# Patient Record
Sex: Female | Born: 1975 | Hispanic: No | Marital: Married | Smoking: Never smoker
Health system: Southern US, Community
[De-identification: ages and names within clinical notes are randomized; demographics above are authoritative.]

## PROBLEM LIST (undated history)

## (undated) DIAGNOSIS — E785 Hyperlipidemia, unspecified: Secondary | ICD-10-CM

## (undated) DIAGNOSIS — I10 Essential (primary) hypertension: Secondary | ICD-10-CM

## (undated) DIAGNOSIS — N946 Dysmenorrhea, unspecified: Secondary | ICD-10-CM

## (undated) DIAGNOSIS — F419 Anxiety disorder, unspecified: Secondary | ICD-10-CM

## (undated) DIAGNOSIS — I499 Cardiac arrhythmia, unspecified: Secondary | ICD-10-CM

## (undated) DIAGNOSIS — F32A Depression, unspecified: Secondary | ICD-10-CM

## (undated) DIAGNOSIS — K602 Anal fissure, unspecified: Secondary | ICD-10-CM

## (undated) DIAGNOSIS — F329 Major depressive disorder, single episode, unspecified: Secondary | ICD-10-CM

## (undated) DIAGNOSIS — B029 Zoster without complications: Secondary | ICD-10-CM

## (undated) DIAGNOSIS — J45990 Exercise induced bronchospasm: Secondary | ICD-10-CM

## (undated) DIAGNOSIS — B2790 Infectious mononucleosis, unspecified without complication: Secondary | ICD-10-CM

## (undated) HISTORY — DX: Anxiety disorder, unspecified: F41.9

## (undated) HISTORY — DX: Dysmenorrhea, unspecified: N94.6

## (undated) HISTORY — DX: Hyperlipidemia, unspecified: E78.5

## (undated) HISTORY — DX: Zoster without complications: B02.9

## (undated) HISTORY — DX: Cardiac arrhythmia, unspecified: I49.9

## (undated) HISTORY — DX: Exercise induced bronchospasm: J45.990

## (undated) HISTORY — DX: Infectious mononucleosis, unspecified without complication: B27.90

## (undated) HISTORY — PX: CRANIECTOMY SUBOCCIPITAL W/ CERVICAL LAMINECTOMY / CHIARI: SUR327

## (undated) HISTORY — DX: Essential (primary) hypertension: I10

## (undated) HISTORY — DX: Depression, unspecified: F32.A

## (undated) HISTORY — DX: Anal fissure, unspecified: K60.2

## (undated) HISTORY — DX: Major depressive disorder, single episode, unspecified: F32.9

## (undated) HISTORY — PX: COLONOSCOPY: SHX174

---
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
## 1999-07-01 ENCOUNTER — Other Ambulatory Visit: Admission: RE | Admit: 1999-07-01 | Discharge: 1999-07-01 | Payer: Self-pay | Admitting: *Deleted

## 2001-03-07 ENCOUNTER — Other Ambulatory Visit: Admission: RE | Admit: 2001-03-07 | Discharge: 2001-03-07 | Payer: Self-pay | Admitting: *Deleted

## 2002-11-05 ENCOUNTER — Other Ambulatory Visit: Admission: RE | Admit: 2002-11-05 | Discharge: 2002-11-05 | Payer: Self-pay | Admitting: Obstetrics and Gynecology

## 2003-04-16 ENCOUNTER — Encounter: Admission: RE | Admit: 2003-04-16 | Discharge: 2003-04-16 | Payer: Self-pay | Admitting: Obstetrics and Gynecology

## 2003-04-28 ENCOUNTER — Emergency Department (HOSPITAL_COMMUNITY): Admission: AD | Admit: 2003-04-28 | Discharge: 2003-04-28 | Payer: Self-pay | Admitting: Family Medicine

## 2003-10-08 ENCOUNTER — Emergency Department (HOSPITAL_COMMUNITY): Admission: EM | Admit: 2003-10-08 | Discharge: 2003-10-08 | Payer: Self-pay | Admitting: Family Medicine

## 2003-11-06 ENCOUNTER — Encounter: Admission: RE | Admit: 2003-11-06 | Discharge: 2003-11-06 | Payer: Self-pay | Admitting: Obstetrics and Gynecology

## 2004-02-22 HISTORY — PX: CRANIECTOMY SUBOCCIPITAL W/ CERVICAL LAMINECTOMY / CHIARI: SUR327

## 2004-05-03 ENCOUNTER — Other Ambulatory Visit: Admission: RE | Admit: 2004-05-03 | Discharge: 2004-05-03 | Payer: Self-pay | Admitting: Obstetrics and Gynecology

## 2004-08-17 ENCOUNTER — Inpatient Hospital Stay (HOSPITAL_COMMUNITY): Admission: RE | Admit: 2004-08-17 | Discharge: 2004-08-21 | Payer: Self-pay | Admitting: Neurosurgery

## 2004-11-19 ENCOUNTER — Other Ambulatory Visit: Payer: Self-pay

## 2004-11-19 ENCOUNTER — Emergency Department: Payer: Self-pay | Admitting: Emergency Medicine

## 2005-10-12 ENCOUNTER — Inpatient Hospital Stay (HOSPITAL_COMMUNITY): Admission: AD | Admit: 2005-10-12 | Discharge: 2005-10-15 | Payer: Self-pay | Admitting: Obstetrics and Gynecology

## 2006-06-26 ENCOUNTER — Ambulatory Visit (HOSPITAL_COMMUNITY): Admission: RE | Admit: 2006-06-26 | Discharge: 2006-06-26 | Payer: Self-pay | Admitting: Neurosurgery

## 2006-11-28 ENCOUNTER — Other Ambulatory Visit: Admission: RE | Admit: 2006-11-28 | Discharge: 2006-11-28 | Payer: Self-pay | Admitting: Obstetrics & Gynecology

## 2008-02-04 ENCOUNTER — Other Ambulatory Visit: Admission: RE | Admit: 2008-02-04 | Discharge: 2008-02-04 | Payer: Self-pay | Admitting: Obstetrics & Gynecology

## 2009-03-02 ENCOUNTER — Encounter: Admission: RE | Admit: 2009-03-02 | Discharge: 2009-03-02 | Payer: Self-pay | Admitting: Obstetrics and Gynecology

## 2009-10-15 DIAGNOSIS — R0602 Shortness of breath: Secondary | ICD-10-CM | POA: Insufficient documentation

## 2009-10-15 DIAGNOSIS — R002 Palpitations: Secondary | ICD-10-CM | POA: Insufficient documentation

## 2009-10-15 DIAGNOSIS — J45990 Exercise induced bronchospasm: Secondary | ICD-10-CM | POA: Insufficient documentation

## 2009-10-15 HISTORY — DX: Palpitations: R00.2

## 2009-10-21 ENCOUNTER — Ambulatory Visit: Payer: Self-pay | Admitting: Cardiology

## 2009-12-03 ENCOUNTER — Ambulatory Visit: Payer: Self-pay

## 2009-12-03 ENCOUNTER — Encounter: Payer: Self-pay | Admitting: Cardiology

## 2009-12-03 ENCOUNTER — Ambulatory Visit (HOSPITAL_COMMUNITY): Admission: RE | Admit: 2009-12-03 | Discharge: 2009-12-03 | Payer: Self-pay | Admitting: Cardiology

## 2009-12-03 ENCOUNTER — Ambulatory Visit: Payer: Self-pay | Admitting: Cardiology

## 2010-01-10 ENCOUNTER — Inpatient Hospital Stay (HOSPITAL_COMMUNITY): Admission: AD | Admit: 2010-01-10 | Discharge: 2010-01-12 | Payer: Self-pay | Admitting: Obstetrics and Gynecology

## 2010-03-21 LAB — CONVERTED CEMR LAB
BUN: 6 mg/dL (ref 6–23)
CO2: 26 meq/L (ref 19–32)
Calcium: 8.8 mg/dL (ref 8.4–10.5)
Creatinine, Ser: 0.4 mg/dL (ref 0.4–1.2)
GFR calc non Af Amer: 185.74 mL/min (ref >60)
Glucose, Bld: 77 mg/dL (ref 70–99)

## 2010-03-25 NOTE — Assessment & Plan Note (Signed)
Summary: np6/ 20 weeks preganant/ skipping beats./ pt has uhc/ gd   Visit Type:  new pt visit Referring Provider:  Malva Limes Primary Provider:  [facility]  CC:  palpitations...denies any cp or sob or edema..pt is [redacted] weeks gestation.  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  She is currently [redacted] weeks pregnant. She had a history of palpitations back in her late 52s with a negative evaluation at that time. I do not have any records.  She notices these most when she is sitting still or lying down. Sometimes they positional change will help. She has not had any syncope or presyncope. There is been no sustained symptoms of tachycardia.  She only drinks one caffeinated beverage a day.  There is no history of thyroid disease. Her hemoglobin was 12.6 in May of this year. There is no previous cardiac history.  She denies orthopnea, PND or edema.  She has one child that she delivered vaginally. She did not have any cardiac or other difficulties.  Current Medications (verified): 1)  Prenatal Vitamins 0.8 Mg Tabs (Prenatal Multivit-Min-Fe-Fa) .Marland Kitchen.. 1 Tab Once Daily  Allergies: 1)  ! Pcn  Past History:  Past Medical History: Last updated: 10/15/2009 Chiari type 1 malformation Candidiasis Asthma history.Marland Kitchenexercise induced  Past Surgical History: Last updated: 10/15/2009 1.  Chiari decompression--craniocervical decompression with dural patch      grafting.  2.  Microdissection.  SURGEON:  Kathaleen Maser. Pool, M.D.  Family History: Last updated: 10/15/2009 Family History of Coronary Artery Disease: MGM & MGF Family History of Hypertension: MGM & MGF Family History of Thyroid Dysfunction: MGF Family History of Renal Disease: MGM Family History of Neuroligic Seizure Disorder: MGF Parkinson's Family History of Cancer: MGM colon ca and MGF Leukemia Family History of Depression: Mother  Social  History: Last updated: 10/15/2009 Full Time...[facility] Married  Drug Use - no  Review of Systems       negative other than history of present illness  Vital Signs:  Patient profile:   35 year old female Height:      69 inches Weight:      204.12 pounds BMI:     30.25 Pulse rate:   72 / minute Pulse rhythm:   irregular BP sitting:   112 / 70  (left arm) Cuff size:   large  Vitals Entered By: Danielle Rankin, CMA (October 21, 2009 10:33 AM)  Physical Exam  General:  Well developed, well nourished, in no acute distress. Head:  normocephalic and atraumatic Eyes:  PERRLA/EOM intact; conjunctiva and lids normal. Neck:  Neck supple, no JVD. No masses, thyromegaly or abnormal cervical nodes. thyroid is palpable but nontender and no signs of asymmetry or cyst Chest Wall:  no deformities or breast masses noted Lungs:  Clear bilaterally to auscultation and percussion. Heart:  PMI nondisplaced, regular rate and rhythm, no murmur rub or gallop. Normal S1-S2, carotid shows equal bilaterally without bruits Abdomen:  gravida, positive bowel Msk:  Back normal, normal gait. Muscle strength and tone normal. Pulses:  pulses normal in all 4 extremities Extremities:  No clubbing or cyanosis. Neurologic:  Alert and oriented x 3. Skin:  Intact without lesions or rashes. Psych:  Normal affect.   Impression & Recommendations:  Problem # 1:  PALPITATIONS (ICD-785.1) Assessment New  I suspect her palpitations are benign. Will check magnesium, potassium, TSH and a 2-D echocardiogram. If these are normal, reassurance were given. This should have  no effect on her method of delivery. I've asked her lemonade caffeine and I suspect her palpitations after delivery will improve. If not all be glad to see her back.  Orders: EKG w/ Interpretation (93000) TLB-BMP (Basic Metabolic Panel-BMET) (80048-METABOL) TLB-TSH (Thyroid Stimulating Hormone) (84443-TSH) TLB-Magnesium (Mg) (83735-MG) Echocardiogram  (Echo)  Patient Instructions: 1)  Your physician recommends that you schedule a follow-up appointment in: as needed with Dr. Daleen Squibb 2)  Your physician recommends that you continue on your current medications as directed. Please refer to the Current Medication list given to you today. 3)  Your physician recommends that you have lab work today : mg, tsh, bmet 4)  Your physician has requested that you have an echocardiogram.  Echocardiography is a painless test that uses sound waves to create images of your heart. It provides your doctor with information about the size and shape of your heart and how well your heart's chambers and valves are working.  This procedure takes approximately one hour. There are no restrictions for this procedure.

## 2010-05-04 LAB — CBC
HCT: 37 % (ref 36.0–46.0)
MCH: 34.2 pg — ABNORMAL HIGH (ref 26.0–34.0)
MCH: 34.5 pg — ABNORMAL HIGH (ref 26.0–34.0)
MCHC: 34.6 g/dL (ref 30.0–36.0)
MCHC: 34.7 g/dL (ref 30.0–36.0)
MCV: 98.5 fL (ref 78.0–100.0)
MCV: 99.8 fL (ref 78.0–100.0)
Platelets: 146 10*3/uL — ABNORMAL LOW (ref 150–400)
RBC: 3.38 MIL/uL — ABNORMAL LOW (ref 3.87–5.11)
RDW: 13.4 % (ref 11.5–15.5)
WBC: 6.5 10*3/uL (ref 4.0–10.5)

## 2010-07-09 NOTE — Discharge Summary (Signed)
[name], [name]              ACCOUNT NO.:  [id number]   MEDICAL RECORD NO.:  [id number]          PATIENT TYPE:  INP   LOCATION:  3039                         FACILITY:  [facility]   PHYSICIAN:  Kathaleen Maser. Pool, M.D.    DATE OF BIRTH:  05-Aug-1975   DATE OF ADMISSION:  08/17/2004  DATE OF DISCHARGE:  08/21/2004                                 DISCHARGE SUMMARY   FINAL DIAGNOSIS:  Chiari type 1 malformation.   OPERATIONS/TREATMENT:  Chiari decompression.   HISTORY OF PRESENT ILLNESS:  Ms. Judy Foster is 35 year old female with symptomatic  Chiari type 1 malformation who presents now for Chiari decompression.   HOSPITAL COURSE:  The patient was taken to the operating room where an  uncomplicated Chiari decompression was performed. Postoperative, the patient  did very well. She was wrapped with mobilize. She had no difficulty with  wound healing or other neurological problems postoperative. She was  discharged on the fourth postoperative day to home.   CONDITION ON DISCHARGE:  Improved.   FOLLOW UP:  Follow-up is in one week in my office.           ______________________________  Kathaleen Maser. Pool, M.D.     HAP/MEDQ  D:  10/12/2004  T:  10/12/2004  Job:  [id number]

## 2010-07-09 NOTE — Op Note (Signed)
Judy Foster, Judy Foster              ACCOUNT NO.:  [id number]   MEDICAL RECORD NO.:  [id number]          PATIENT TYPE:  INP   LOCATION:  2899                         FACILITY:  [facility]   PHYSICIAN:  Kathaleen Maser. Pool, M.D.    DATE OF BIRTH:  May 20, 1975   DATE OF PROCEDURE:  08/17/2004  DATE OF DISCHARGE:                                 OPERATIVE REPORT   PREOPERATIVE DIAGNOSIS:  Chiari type 1 malformation.   POSTOPERATIVE DIAGNOSIS:  Chiari type 1 malformation.   OPERATION PERFORMED:  1.  Chiari decompression--craniocervical decompression with dural patch      grafting.  2.  Microdissection.   SURGEON:  Kathaleen Maser. Pool, M.D.   ASSISTANT:  Tia Alert, MD   ANESTHESIA:  General orotracheal anesthesia.   INDICATIONS FOR PROCEDURE:  Judy Foster is a 35 year old female with a history  of suboccipital headaches and neck pain with extension into both upper  extremities.  Work-up demonstrates evidence of a very significant type 1  Chiari malformation.  The patient has been counseled as to her options.  She  has decided to proceed with a Chiari decompression for hopeful improvement  in her symptoms.   DESCRIPTION OF PROCEDURE:  The patient was taken to the operating room and  placed on the table in supine position.  After an adequate level of  anesthesia was achieved, the patient was positioned prone onto bolsters with  her head fixed in a somewhat flexed position.  The head was held in place  with a Mayfield pin head rest.  The patient's posterior cervical and  suboccipital scalp was prepped and draped sterilely. The 10 blade was used  to make a linear skin incision extending from the occiput down to the level  of the C2 spinous process. This was carried down sharply in the midline.  Subperiosteal dissection was then performed exposing the lamina of C1 and C2  as well as the suboccipital bone.  Deep self-retaining retractors were  placed.  A suboccipital craniectomy was then performed  using the high speed  drill and Kerrison rongeurs to remove the inferior aspect of the  suboccipital bone from the level of the occiput down to the foramen magnum  and to the posterior aspect of the occipital condyles bilaterally.  The C1  lamina was also removed.  The circular sinus was coagulated and divided.  The dura was then opened in the midline at approximately the C1 level.  CSF  was allowed to drain.  The dural opening was then extended cranially. The  cerebellar tonsils came in to view.  The dural opening was then cut in the  shape of a Y overlying the cerebellar hemispheres.  Microscope was brought  in for microdissection.  The cerebellar tonsils were dissected free.  Dissection then proceeded down to the level of the obex.  CSF was released  under pressure. There was no evidence of any obstructive mass or other  problems.  A dural patch graft consisting of Duragard/bovine pericardium was  then cut to the appropriate size and then sutured into the dura in a running  fashion  using 4-0 Nurolon sutures. After a watertight closure had been  achieved, the patient was given a Valsalva maneuver and there was no  evidence of leakage found the dural repair. The dural repair was then  further covered with Duragen and Tisseel fibrin glue  sealant. Gelfoam was placed over this repair.  The wound was then closed in  layers with Vicryl sutures.  Steri-Strips and sterile dressing were applied.  There were no apparent complications.  The patient tolerated the procedure  well and she returned to recovery room postoperatively.       HAP/MEDQ  D:  08/17/2004  T:  08/17/2004  Job:  [id number]

## 2012-05-16 ENCOUNTER — Telehealth: Payer: Self-pay | Admitting: Certified Nurse Midwife

## 2012-05-16 ENCOUNTER — Encounter: Payer: Self-pay | Admitting: Certified Nurse Midwife

## 2012-05-16 NOTE — Telephone Encounter (Signed)
Spoke with pt who reports moodiness, irritability, and anger with cycles for 4-5 months. Would like to discuss with DL. Appt sched 05-17-12 at 4 pm per pt request. aa

## 2012-05-16 NOTE — Telephone Encounter (Signed)
Pt is having problems with her cycle and would like nurse to call.

## 2012-05-17 ENCOUNTER — Encounter: Payer: Self-pay | Admitting: Certified Nurse Midwife

## 2012-05-17 ENCOUNTER — Ambulatory Visit (INDEPENDENT_AMBULATORY_CARE_PROVIDER_SITE_OTHER): Payer: 59 | Admitting: Certified Nurse Midwife

## 2012-05-17 VITALS — BP 90/60

## 2012-05-17 DIAGNOSIS — N939 Abnormal uterine and vaginal bleeding, unspecified: Secondary | ICD-10-CM

## 2012-05-17 DIAGNOSIS — N926 Irregular menstruation, unspecified: Secondary | ICD-10-CM

## 2012-05-17 DIAGNOSIS — N943 Premenstrual tension syndrome: Secondary | ICD-10-CM

## 2012-05-17 NOTE — Progress Notes (Signed)
37 yo mwf g3 p2012 here complaining of irritability around 2 days before menstrual cycle and then "blah" feeling for 2 days into cycle. Also has occasional headache, but no migraine.  Some fatigue all the time with some heat/cold intolerance.  Does regular exercise. Sleeps well.  No crying episodes just has these few days of not being herself  O:  Healthy female appropriate dress.  Smiling not anxious appearance.  Orientation X 3 AEX 03-11-12 all WNLl  Exam: Thyroid normal no nodules  A: Premenstrual Symptoms 2-Fatigue rule out Thyroid issues  P;Discussed concerns with PMS, given diet to follow and start on B Complex with Vitamin C on two weeks prior to menses. Increase outdoor activity  Keep menses calendar of symptoms  Patient agreeable to plan 2- Labs TSH, Vitamin D RV 1 month  30 minutes spent with patient with >50% of time spent in face to face counseling. Reviewed, TL

## 2012-05-18 LAB — TSH: TSH: 1.699 u[IU]/mL (ref 0.350–4.500)

## 2012-05-22 NOTE — Progress Notes (Signed)
05/22/12 @ 10:35am lmtcb

## 2012-07-16 ENCOUNTER — Encounter (HOSPITAL_COMMUNITY): Payer: Self-pay | Admitting: Emergency Medicine

## 2012-07-16 ENCOUNTER — Emergency Department (HOSPITAL_COMMUNITY)
Admission: EM | Admit: 2012-07-16 | Discharge: 2012-07-16 | Disposition: A | Discharge status: Home / Self Care | Payer: 59 | Attending: Emergency Medicine | Admitting: Emergency Medicine

## 2012-07-16 DIAGNOSIS — Z79899 Other long term (current) drug therapy: Secondary | ICD-10-CM | POA: Insufficient documentation

## 2012-07-16 DIAGNOSIS — R450 Nervousness: Secondary | ICD-10-CM | POA: Insufficient documentation

## 2012-07-16 DIAGNOSIS — Z8742 Personal history of other diseases of the female genital tract: Secondary | ICD-10-CM | POA: Insufficient documentation

## 2012-07-16 DIAGNOSIS — R110 Nausea: Secondary | ICD-10-CM | POA: Insufficient documentation

## 2012-07-16 DIAGNOSIS — F410 Panic disorder [episodic paroxysmal anxiety] without agoraphobia: Secondary | ICD-10-CM | POA: Insufficient documentation

## 2012-07-16 DIAGNOSIS — J45901 Unspecified asthma with (acute) exacerbation: Secondary | ICD-10-CM | POA: Insufficient documentation

## 2012-07-16 DIAGNOSIS — R197 Diarrhea, unspecified: Secondary | ICD-10-CM | POA: Insufficient documentation

## 2012-07-16 DIAGNOSIS — Z8619 Personal history of other infectious and parasitic diseases: Secondary | ICD-10-CM | POA: Insufficient documentation

## 2012-07-16 DIAGNOSIS — Z880 Allergy status to penicillin: Secondary | ICD-10-CM | POA: Insufficient documentation

## 2012-07-16 DIAGNOSIS — R42 Dizziness and giddiness: Secondary | ICD-10-CM | POA: Insufficient documentation

## 2012-07-16 MED ORDER — LORAZEPAM 1 MG PO TABS
1.0000 mg | ORAL_TABLET | Freq: Once | ORAL | Status: AC
  Administered 2012-07-16: 1 mg via ORAL
  Filled 2012-07-16: qty 1

## 2012-07-16 NOTE — ED Notes (Addendum)
Pt reports sudden onset of abdominal pain while eating and had diarrhea. Pt reports that she felt "impending doom for a few minutes". Also "I felt like I was hyperventilating" and "it was nothing I had felt before." Pt also reports feeling dizzy.

## 2012-07-16 NOTE — ED Provider Notes (Signed)
History     CSN: [id number]  Arrival date & time 07/16/12  1458   First MD Initiated Contact with Patient 07/16/12 1524      Chief Complaint  Patient presents with  . Diarrhea  . Nausea  . Palpitations    (Consider location/radiation/quality/duration/timing/severity/associated sxs/prior treatment) HPI Comments: 37 year old female no significant past medical history presents to emergency department complaining of "not feeling right" while eating lunch today with her mother and daughter. Patient states while she was eating, she developed "queasiness", stood up, felt heart palpitations and "impending doom" lasting only a few minutes. She felt as if she was hyperventilating and became very anxious, dizzy, followed by an episode of diarrhea. States she was taking her mom and daughter out to lunch because she recently found out that her mom's boyfriend is cheating on her and wants to do something nice. Admits this is causing her some anxiety. Denies being under increased stress, however states her job can get stressful and she is in a period of transitioning. Denies fever, vomiting, abdominal pain. Continues to state "I am so embarrassed". All symptoms have since subsided besides "queeziness".   Patient is a 37 y.o. female presenting with diarrhea and palpitations. The history is provided by the patient.  Diarrhea Associated symptoms: no abdominal pain, no chills, no fever, no headaches and no vomiting   Palpitations Associated symptoms: dizziness and nausea   Associated symptoms: no back pain, no chest pain, no shortness of breath and no vomiting     Past Medical History  Diagnosis Date  . Dysmenorrhea   . Asthma, exercise induced   . Mononucleosis 1993    Past Surgical History  Procedure Laterality Date  . Craniectomy suboccipital w/ cervical laminectomy / chiari      Family History  Problem Relation Age of Onset  . Cancer Maternal Grandmother     colon cancer  . Cancer  Maternal Grandfather     leukemia  . Heart disease Maternal Grandfather     History  Substance Use Topics  . Smoking status: Never Smoker   . Smokeless tobacco: Not on file  . Alcohol Use: No    OB History   Grav Para Term Preterm Abortions TAB SAB Ect Mult Living   3    1  1   2       Review of Systems  Constitutional: Negative for fever, chills and appetite change.  Eyes: Negative for visual disturbance.  Respiratory: Negative for chest tightness and shortness of breath.   Cardiovascular: Positive for palpitations. Negative for chest pain.  Gastrointestinal: Positive for nausea and diarrhea. Negative for vomiting, abdominal pain and blood in stool.  Genitourinary: Negative for dysuria, urgency, frequency, vaginal bleeding, vaginal discharge and pelvic pain.  Musculoskeletal: Negative for back pain.  Neurological: Positive for dizziness. Negative for syncope, weakness, light-headedness and headaches.  Psychiatric/Behavioral: Negative for confusion. The patient is nervous/anxious.   All other systems reviewed and are negative.    Allergies  Penicillins  Home Medications   Current Outpatient Rx  Name  Route  Sig  Dispense  Refill  . fexofenadine (ALLEGRA) 180 MG tablet   Oral   Take 180 mg by mouth every morning.         . minocycline (MINOCIN,DYNACIN) 100 MG capsule   Oral   Take 100 mg by mouth 2 (two) times daily.         . Multiple Vitamin (MULTIVITAMIN WITH MINERALS) TABS   Oral   Take 1 tablet  by mouth every morning.         . psyllium (HYDROCIL/METAMUCIL) 95 % PACK   Oral   Take 1 packet by mouth daily.           BP 131/76  Pulse 65  Temp(Src) 98 F (36.7 C) (Oral)  SpO2 100%  LMP 07/05/2012  Physical Exam  Nursing note and vitals reviewed. Constitutional: She is oriented to person, place, and time. She appears well-developed and well-nourished. No distress.  HENT:  Head: Normocephalic and atraumatic.  Mouth/Throat: Oropharynx is clear  and moist.  Eyes: Conjunctivae and EOM are normal. Pupils are equal, round, and reactive to light.  Neck: Normal range of motion. Neck supple.  Cardiovascular: Normal rate, regular rhythm, normal heart sounds and intact distal pulses.  Exam reveals no gallop and no friction rub.   No murmur heard. Pulmonary/Chest: Effort normal and breath sounds normal. No respiratory distress. She has no wheezes. She exhibits no tenderness.  Abdominal: Soft. Bowel sounds are normal. She exhibits no distension and no mass. There is no rigidity, no rebound and no guarding.  Palpation of mid-epigastric region creates "queeziness".  Musculoskeletal: Normal range of motion. She exhibits no edema.  Lymphadenopathy:    She has no cervical adenopathy.  Neurological: She is alert and oriented to person, place, and time. She has normal strength. No sensory deficit. Gait normal.  Skin: Skin is warm and dry. She is not diaphoretic.  Psychiatric: Her speech is normal and behavior is normal. Thought content normal. Her mood appears anxious.  Tearful    ED Course  Procedures (including critical care time)  Labs Reviewed - No data to display No results found.   1. Panic attack       MDM  37 y/o female with anxiety attack. Denies history of same, but admits to post-partum depression with her second child. States the lunch with her mom was supposed to be a nice way to help her mom, but just caused her anxiety. Continues to states he is embarrassed and apologizing. Comforted her and told her no need to apologize. She will ativan given. She will f/u with PCP this week. Stress management discussed. Return precautions discussed. Patient states understanding of plan and is agreeable.     Trevor Mace, PA-C 07/16/12 1624

## 2012-07-16 NOTE — ED Notes (Signed)
Previous triage notes were not entered by Milus Glazier but by [place] Sayge Salvato Rn.

## 2012-07-18 NOTE — ED Provider Notes (Signed)
Medical screening examination/treatment/procedure(s) were performed by non-physician practitioner and as supervising physician I was immediately available for consultation/collaboration.  Mabel Roll T Rosangelica Pevehouse, MD 07/18/12 2321 

## 2013-03-04 ENCOUNTER — Ambulatory Visit: Payer: Self-pay | Admitting: Certified Nurse Midwife

## 2013-03-21 ENCOUNTER — Encounter: Payer: Self-pay | Admitting: Certified Nurse Midwife

## 2013-03-21 ENCOUNTER — Ambulatory Visit (INDEPENDENT_AMBULATORY_CARE_PROVIDER_SITE_OTHER): Payer: 59 | Admitting: Certified Nurse Midwife

## 2013-03-21 VITALS — BP 109/71 | HR 72 | Resp 16 | Ht 68.5 in | Wt 183.0 lb

## 2013-03-21 DIAGNOSIS — Z01419 Encounter for gynecological examination (general) (routine) without abnormal findings: Secondary | ICD-10-CM

## 2013-03-21 NOTE — Progress Notes (Signed)
Reviewed personally.  M. Suzanne Shaqueta Casady, MD.  

## 2013-03-21 NOTE — Patient Instructions (Signed)

## 2013-03-21 NOTE — Progress Notes (Signed)
38 y.o. [id number] Married Caucasian Fe here for annual exam. Periods normal, no issues. Contraception working well. Patient had panic attack and was seen in ER and restarted on medication. Working well. Continues to have palpitations and under evaluation with PCP at this point. No other health issues.    Patient's last menstrual period was 03/16/2013.          Sexually active: yes  The current method of family planning is withdrawal.    Exercising: yes  running Smoker:  no  Health Maintenance: Pap:  03-01-12 neg HPV HR neg MMG:  none Colonoscopy:  none BMD:   none TDaP:  2007 Labs: none Self breast exam: done monthly   reports that she has never smoked. She does not have any smokeless tobacco history on file. She reports that she does not drink alcohol or use illicit drugs.  Past Medical History  Diagnosis Date  . Asthma, exercise induced     as a teen  . Mononucleosis 1993  . Dysmenorrhea     past  . Anxiety     Past Surgical History  Procedure Laterality Date  . Craniectomy suboccipital w/ cervical laminectomy / chiari      Current Outpatient Prescriptions  Medication Sig Dispense Refill  . ALPRAZolam (XANAX) 0.5 MG tablet Take 0.5 mg by mouth at bedtime as needed for anxiety.      Marland Kitchen FLUoxetine (PROZAC) 10 MG tablet Take 10 mg by mouth every other day.       . Multiple Vitamin (MULTIVITAMIN WITH MINERALS) TABS Take 1 tablet by mouth every morning.       No current facility-administered medications for this visit.    Family History  Problem Relation Age of Onset  . Cancer Maternal Grandmother     colon cancer  . Thyroid disease Maternal Grandmother   . Cancer Maternal Grandfather     leukemia  . Heart disease Maternal Grandfather   . Hypertension Mother   . Thyroid disease Mother     hypothyroid    ROS:  Pertinent items are noted in HPI.  Otherwise, a comprehensive ROS was negative.  Exam:   BP 109/71  Pulse 72  Resp 16  Ht 5' 8.5" (1.74 m)  Wt 183 lb  (83.008 kg)  BMI 27.42 kg/m2  LMP 03/16/2013 Height: 5' 8.5" (174 cm)  Ht Readings from Last 3 Encounters:  03/21/13 5' 8.5" (1.74 m)  10/21/09 5\' 9"  (1.753 m)    General appearance: alert, cooperative and appears stated age Head: Normocephalic, without obvious abnormality, atraumatic Neck: no adenopathy, supple, symmetrical, trachea midline and thyroid normal to inspection and palpation Lungs: clear to auscultation bilaterally Breasts: normal appearance, no masses or tenderness, No nipple retraction or dimpling, No nipple discharge or bleeding, No axillary or supraclavicular adenopathy Heart: regular rate and rhythm Abdomen: soft, non-tender; no masses,  no organomegaly Extremities: extremities normal, atraumatic, no cyanosis or edema Skin: Skin color, texture, turgor normal. No rashes or lesions Lymph nodes: Cervical, supraclavicular, and axillary nodes normal. No abnormal inguinal nodes palpated Neurologic: Grossly normal   Pelvic: External genitalia:  no lesions              Urethra:  normal appearing urethra with no masses, tenderness or lesions              Bartholin's and Skene's: normal                 Vagina: normal appearing vagina with normal color and discharge, no  lesions              Cervix: normal, non tender              Pap taken: no Bimanual Exam:  Uterus:  normal size, contour, position, consistency, mobility, non-tender and anteverted              Adnexa: normal adnexa and no mass, fullness, tenderness               Rectovaginal: Confirms               Anus:  normal sphincter tone, no lesions  A:  Well Woman with normal exam  Contraception withdrawal working well, spouse to have vasectomy  Anxiety on medication now with PCP management  Palpitations with negative work up, patient to continue follow up    P:   Reviewed health and wellness pertinent to exam  Pap smear as per guidelines   Continue follow up as indicated.  pap smear not taken today counseled  on breast self exam, adequate intake of calcium and vitamin D, diet and exercise  return annually or prn  An After Visit Summary was printed and given to the patient.

## 2013-09-25 ENCOUNTER — Encounter: Payer: Self-pay | Admitting: Gastroenterology

## 2013-09-30 ENCOUNTER — Encounter: Payer: Self-pay | Admitting: Gastroenterology

## 2013-09-30 ENCOUNTER — Other Ambulatory Visit (INDEPENDENT_AMBULATORY_CARE_PROVIDER_SITE_OTHER): Payer: 59

## 2013-09-30 ENCOUNTER — Ambulatory Visit (INDEPENDENT_AMBULATORY_CARE_PROVIDER_SITE_OTHER): Payer: 59 | Admitting: Gastroenterology

## 2013-09-30 VITALS — BP 108/72 | HR 68 | Ht 68.5 in | Wt 190.0 lb

## 2013-09-30 DIAGNOSIS — R198 Other specified symptoms and signs involving the digestive system and abdomen: Secondary | ICD-10-CM

## 2013-09-30 DIAGNOSIS — Z8371 Family history of colonic polyps: Secondary | ICD-10-CM

## 2013-09-30 DIAGNOSIS — Z83719 Family history of colon polyps, unspecified: Secondary | ICD-10-CM

## 2013-09-30 DIAGNOSIS — R194 Change in bowel habit: Secondary | ICD-10-CM

## 2013-09-30 LAB — COMPREHENSIVE METABOLIC PANEL
ALBUMIN: 4.5 g/dL (ref 3.5–5.2)
ALK PHOS: 48 U/L (ref 39–117)
ALT: 17 U/L (ref 0–35)
AST: 20 U/L (ref 0–37)
BUN: 11 mg/dL (ref 6–23)
CALCIUM: 9.4 mg/dL (ref 8.4–10.5)
CHLORIDE: 100 meq/L (ref 96–112)
CO2: 27 mEq/L (ref 19–32)
Creatinine, Ser: 0.8 mg/dL (ref 0.4–1.2)
GFR: 92.92 mL/min (ref >60.00)
Glucose, Bld: 98 mg/dL (ref 70–99)
POTASSIUM: 4.3 meq/L (ref 3.5–5.1)
SODIUM: 136 meq/L (ref 135–145)
TOTAL PROTEIN: 8.1 g/dL (ref 6.0–8.3)
Total Bilirubin: 0.8 mg/dL (ref 0.2–1.2)

## 2013-09-30 LAB — CBC WITH DIFFERENTIAL/PLATELET
BASOS PCT: 0.3 % (ref 0.0–3.0)
Basophils Absolute: 0 10*3/uL (ref 0.0–0.1)
EOS ABS: 0.1 10*3/uL (ref 0.0–0.7)
Eosinophils Relative: 1.5 % (ref 0.0–5.0)
HCT: 38.6 % (ref 36.0–46.0)
Hemoglobin: 13.2 g/dL (ref 12.0–15.0)
LYMPHS PCT: 42.6 % (ref 12.0–46.0)
Lymphs Abs: 2.7 10*3/uL (ref 0.7–4.0)
MCHC: 34.3 g/dL (ref 30.0–36.0)
MCV: 92.9 fl (ref 78.0–100.0)
MONO ABS: 0.5 10*3/uL (ref 0.1–1.0)
Monocytes Relative: 7.4 % (ref 3.0–12.0)
NEUTROS PCT: 48.2 % (ref 43.0–77.0)
Neutro Abs: 3.1 10*3/uL (ref 1.4–7.7)
PLATELETS: 245 10*3/uL (ref 150.0–400.0)
RBC: 4.15 Mil/uL (ref 3.87–5.11)
RDW: 12.5 % (ref 11.5–15.5)
WBC: 6.3 10*3/uL (ref 4.0–10.5)

## 2013-09-30 MED ORDER — MOVIPREP 100 G PO SOLR: 1.0000 | Freq: Once | ORAL | Status: DC

## 2013-09-30 NOTE — Patient Instructions (Signed)
Try lactaid brand pills. You will be set up for a colonoscopy (for change bowels, family history of colon polyps, mom) You will have labs checked today in the basement lab.  Please head down after you check out with the front desk  (cbc, cmet, celiac panel).

## 2013-09-30 NOTE — Progress Notes (Signed)
HPI: This is a     very pleasant 38 year old woman whom I am meeting for the first time today.  gassiness, incomplete defecation, no overt gi bleeding.  Mother had colonoscopy at 7, had 42 polyps.  Stopped milk and yogurt and helped somewhat.  Semiformed, dented stool at times.  Has tried imodium, this causes constipation.  Works at [employer], Therapist, art.  In past 3-4 months, bowel changes,  Very gassy.  Overall gained weight.  Clearly worse after some dairy intakes.     Review of systems: Pertinent positive and negative review of systems were noted in the above HPI section. Complete review of systems was performed and was otherwise normal.    Past Medical History  Diagnosis Date  . Asthma, exercise induced     as a teen  . Mononucleosis 1993  . Dysmenorrhea     past  . Anxiety   . Anal fissure   . Cardiac arrhythmia   . Depression   . HLD (hyperlipidemia)     Past Surgical History  Procedure Laterality Date  . Craniectomy suboccipital w/ cervical laminectomy / chiari      Current Outpatient Prescriptions  Medication Sig Dispense Refill  . ALPRAZolam (XANAX) 0.5 MG tablet Take 0.5 mg by mouth at bedtime as needed for anxiety.      Marland Kitchen FLUoxetine (PROZAC) 10 MG tablet Take 10 mg by mouth every other day.       . Multiple Vitamin (MULTIVITAMIN WITH MINERALS) TABS Take 1 tablet by mouth every morning.       No current facility-administered medications for this visit.    Allergies as of 09/30/2013 - Review Complete 09/30/2013  Allergen Reaction Noted  . Penicillins Rash     Family History  Problem Relation Age of Onset  . Colon cancer Maternal Grandmother   . Thyroid disease Maternal Grandmother   . Leukemia Maternal Grandfather   . Heart disease Maternal Grandfather   . Hypertension Mother   . Thyroid disease Mother     hypothyroid  . Stroke Maternal Grandmother   . Colon polyps Mother   . Kidney disease Maternal Grandmother     History   Social  History  . Marital Status: Married    Spouse Name: N/A    Number of Children: 2  . Years of Education: N/A   Occupational History  . Nurse    Social History Main Topics  . Smoking status: Never Smoker   . Smokeless tobacco: Never Used  . Alcohol Use: No  . Drug Use: No  . Sexual Activity: Yes    Partners: Male     Comment: withdrawal   Other Topics Concern  . Not on file   Social History Narrative  . No narrative on file       Physical Exam: BP 108/72  Pulse 68  Ht 5' 8.5" (1.74 m)  Wt 190 lb (86.183 kg)  BMI 28.47 kg/m2  LMP 09/30/2013 Constitutional: generally well-appearing Psychiatric: alert and oriented x3 Eyes: extraocular movements intact Mouth: oral pharynx moist, no lesions Neck: supple no lymphadenopathy Cardiovascular: heart regular rate and rhythm Lungs: clear to auscultation bilaterally Abdomen: soft, nontender, nondistended, no obvious ascites, no peritoneal signs, normal bowel sounds Extremities: no lower extremity edema bilaterally Skin: no lesions on visible extremities    Assessment and plan: 38 y.o. female with  chronic bowel difficulties, predominantly loose stools, change in bowel habits recently with unusual shaped stool, mother with multiple colon polyps  First a dose so  if she has clinical lactose intolerance. I recommended she try Lactaid burning and supplements whenever she has dairy intake to see if that helps. She'll get a basic set of labs including CBC, complete metabolic profile, celiac panel. Given her mother's history of multiple colon polyps and her unusual-appearing stool are like to proceed with colonoscopy at her soonest convenience as well and

## 2013-10-01 LAB — CELIAC PANEL 10
Endomysial Screen: NEGATIVE
GLIADIN IGA: 4.7 U/mL (ref <20)
Gliadin IgG: 6.1 U/mL (ref <20)
IgA: 236 mg/dL (ref 69–380)
TISSUE TRANSGLUT AB: 6.8 U/mL (ref <20)
Tissue Transglutaminase Ab, IgA: 5.4 U/mL (ref <20)

## 2013-10-02 ENCOUNTER — Encounter: Payer: 59 | Admitting: Gastroenterology

## 2013-11-20 ENCOUNTER — Encounter: Payer: Self-pay | Admitting: Gastroenterology

## 2013-11-20 ENCOUNTER — Ambulatory Visit (AMBULATORY_SURGERY_CENTER): Payer: 59 | Admitting: Gastroenterology

## 2013-11-20 VITALS — BP 110/65 | HR 54 | Temp 98.1°F | Resp 18 | Ht 68.0 in | Wt 190.0 lb

## 2013-11-20 DIAGNOSIS — R198 Other specified symptoms and signs involving the digestive system and abdomen: Secondary | ICD-10-CM

## 2013-11-20 DIAGNOSIS — D126 Benign neoplasm of colon, unspecified: Secondary | ICD-10-CM

## 2013-11-20 DIAGNOSIS — R933 Abnormal findings on diagnostic imaging of other parts of digestive tract: Secondary | ICD-10-CM

## 2013-11-20 MED ORDER — SODIUM CHLORIDE 0.9 % IV SOLN: 500.0000 mL | INTRAVENOUS | Status: DC

## 2013-11-20 NOTE — Progress Notes (Signed)
Called to room to assist during endoscopic procedure.  Patient ID and intended procedure confirmed with present staff. Received instructions for my participation in the procedure from the performing physician.  

## 2013-11-20 NOTE — Patient Instructions (Signed)
Discharge instructions given with verbal understanding. Biopsies taken. Resume previous medications. YOU HAD AN ENDOSCOPIC PROCEDURE TODAY AT THE [facility]: Refer to the procedure report that was given to you for any specific questions about what was found during the examination.  If the procedure report does not answer your questions, please call your gastroenterologist to clarify.  If you requested that your care partner not be given the details of your procedure findings, then the procedure report has been included in a sealed envelope for you to review at your convenience later.  YOU SHOULD EXPECT: Some feelings of bloating in the abdomen. Passage of more gas than usual.  Walking can help get rid of the air that was put into your GI tract during the procedure and reduce the bloating. If you had a lower endoscopy (such as a colonoscopy or flexible sigmoidoscopy) you may notice spotting of blood in your stool or on the toilet paper. If you underwent a bowel prep for your procedure, then you may not have a normal bowel movement for a few days.  DIET: Your first meal following the procedure should be a light meal and then it is ok to progress to your normal diet.  A half-sandwich or bowl of soup is an example of a good first meal.  Heavy or fried foods are harder to digest and may make you feel nauseous or bloated.  Likewise meals heavy in dairy and vegetables can cause extra gas to form and this can also increase the bloating.  Drink plenty of fluids but you should avoid alcoholic beverages for 24 hours.  ACTIVITY: Your care partner should take you home directly after the procedure.  You should plan to take it easy, moving slowly for the rest of the day.  You can resume normal activity the day after the procedure however you should NOT DRIVE or use heavy machinery for 24 hours (because of the sedation medicines used during the test).    SYMPTOMS TO REPORT IMMEDIATELY: A gastroenterologist  can be reached at any hour.  During normal business hours, 8:30 AM to 5:00 PM Monday through Friday, call [phone].  After hours and on weekends, please call the GI answering service at [phone] who will take a message and have the physician on call contact you.   Following lower endoscopy (colonoscopy or flexible sigmoidoscopy):  Excessive amounts of blood in the stool  Significant tenderness or worsening of abdominal pains  Swelling of the abdomen that is new, acute  Fever of 100F or higher  FOLLOW UP: If any biopsies were taken you will be contacted by phone or by letter within the next 1-3 weeks.  Call your gastroenterologist if you have not heard about the biopsies in 3 weeks.  Our staff will call the home number listed on your records the next business day following your procedure to check on you and address any questions or concerns that you may have at that time regarding the information given to you following your procedure. This is a courtesy call and so if there is no answer at the home number and we have not heard from you through the emergency physician on call, we will assume that you have returned to your regular daily activities without incident.  SIGNATURES/CONFIDENTIALITY: You and/or your care partner have signed paperwork which will be entered into your electronic medical record.  These signatures attest to the fact that that the information above on your After Visit Summary has been reviewed   and is understood.  Full responsibility of the confidentiality of this discharge information lies with you and/or your care-partner.  

## 2013-11-20 NOTE — Op Note (Signed)
[facility]  [address] [place], [id number]   COLONOSCOPY PROCEDURE REPORT  PATIENT: Judy, Foster  MR#: [id number] BIRTHDATE: [birth year] , 38  yrs. old GENDER: female ENDOSCOPIST: Milus Banister, MD REFERRED UK:GURK Sabra Heck, M.D. PROCEDURE DATE:  11/20/2013 PROCEDURE:   Colonoscopy with biopsy First Screening Colonoscopy - Avg.  risk and is 50 yrs.  old or older - No.  Prior Negative Screening - Now for repeat screening. N/A  History of Adenoma - Now for follow-up colonoscopy & has been > or = to 3 yrs.  N/A  Polyps Removed Today? No.  Recommend repeat exam, <10 yrs? No. ASA CLASS:   Class II INDICATIONS:change in bowel habits. MEDICATIONS: Monitored anesthesia care and Propofol 300 mg IV  DESCRIPTION OF PROCEDURE:   After the risks benefits and alternatives of the procedure were thoroughly explained, informed consent was obtained.  The digital rectal exam revealed no abnormalities of the rectum.   The LB [id number]  endoscope was introduced through the anus and advanced to the terminal ileum which was intubated for a short distance. No adverse events experienced.   The quality of the prep was excellent.  The instrument was then slowly withdrawn as the colon was fully examined.  COLON FINDINGS: The terminal ileum was normal.  The colon mucosa was normal throughout except for very mild erythema, granularity in the left colon.  This region was biopsied and sent to pathology.  The examination was otherwise normal.  Retroflexed views revealed no abnormalities. The time to cecum=5 minutes 02 seconds.  Withdrawal time=7 minutes 19 seconds.  The scope was withdrawn and the procedure completed. COMPLICATIONS: There were no immediate complications.  ENDOSCOPIC IMPRESSION: The terminal ileum was normal.  The colon mucosa was normal throughout except for very mild erythema, granularity in the left colon.  This region was biopsied and sent to pathology.   The examination was otherwise normal  RECOMMENDATIONS: Await final biopsies.  You should start colon cancer screening at the age 66.  eSigned:  Milus Banister, MD 11/20/2013 3:29 PM

## 2013-11-20 NOTE — Progress Notes (Signed)
Report to PACU, RN, vss, BBS= Clear.  

## 2013-11-21 ENCOUNTER — Telehealth: Payer: Self-pay

## 2013-11-21 NOTE — Telephone Encounter (Signed)
  Follow up Call-  Call back number 11/20/2013  Post procedure Call Back phone  # [phone]  Permission to leave phone message Yes     Patient questions:  Do you have a fever, pain , or abdominal swelling? No. Pain Score  0 *  Have you tolerated food without any problems? Yes.    Have you been able to return to your normal activities? Yes.    Do you have any questions about your discharge instructions: Diet   No. Medications  No. Follow up visit  No.  Do you have questions or concerns about your Care? No.  Actions: * If pain score is 4 or above: No action needed, pain <4.

## 2013-12-23 ENCOUNTER — Encounter: Payer: Self-pay | Admitting: Gastroenterology

## 2014-03-25 ENCOUNTER — Ambulatory Visit: Payer: 59 | Admitting: Certified Nurse Midwife

## 2014-04-29 ENCOUNTER — Ambulatory Visit (INDEPENDENT_AMBULATORY_CARE_PROVIDER_SITE_OTHER): Payer: 59 | Admitting: Certified Nurse Midwife

## 2014-04-29 ENCOUNTER — Encounter: Payer: Self-pay | Admitting: Certified Nurse Midwife

## 2014-04-29 VITALS — BP 112/72 | HR 90 | Ht 68.75 in | Wt 198.8 lb

## 2014-04-29 DIAGNOSIS — Z124 Encounter for screening for malignant neoplasm of cervix: Secondary | ICD-10-CM

## 2014-04-29 DIAGNOSIS — Z01419 Encounter for gynecological examination (general) (routine) without abnormal findings: Secondary | ICD-10-CM | POA: Diagnosis not present

## 2014-04-29 NOTE — Progress Notes (Addendum)
39 y.o. [id number] Married  Caucasian Fe here for annual exam. Periods are changing with smaller amount, but same duration. Sees PCP for aex, labs and medication management. Struggling with times of fatigue, but feels it may be her Prozac. She had weaned off in 2015, but has been back on for the past 2 months with PCP management. Plans another visit with PCP to discuss possible change or wean of medication again. No health issues today.  Patient's last menstrual period was 04/18/2014.          Sexually active: Yes.    The current method of family planning is Husband has had vasectomy.    Exercising: Yes.    aerobics, strength training 3x/wk Smoker:  no  Health Maintenance: Pap:  03/01/12 NEG HR HPV negative    Colonoscopy:  10/15- Normal f/u Age 84 TDaP:  2007 Labs: Kathyrn Lass, MD   reports that she has never smoked. She has never used smokeless tobacco. She reports that she does not drink alcohol or use illicit drugs.  Past Medical History  Diagnosis Date  . Asthma, exercise induced     as a teen  . Mononucleosis 1993  . Dysmenorrhea     past  . Anxiety   . Anal fissure   . Cardiac arrhythmia   . Depression   . HLD (hyperlipidemia)     Past Surgical History  Procedure Laterality Date  . Craniectomy suboccipital w/ cervical laminectomy / chiari      Current Outpatient Prescriptions  Medication Sig Dispense Refill  . ALPRAZolam (XANAX) 0.5 MG tablet Take 0.5 mg by mouth at bedtime as needed for anxiety.    . cholecalciferol (VITAMIN D) 400 UNITS TABS tablet Take 400 Units by mouth.    Marland Kitchen FLUoxetine (PROZAC) 10 MG tablet Take 10 mg by mouth daily.     . Multiple Vitamin (MULTIVITAMIN WITH MINERALS) TABS Take 1 tablet by mouth every morning.     No current facility-administered medications for this visit.    Family History  Problem Relation Age of Onset  . Colon cancer Maternal Grandmother   . Thyroid disease Maternal Grandmother   . Leukemia Maternal Grandfather   . Heart  disease Maternal Grandfather   . Hypertension Mother   . Thyroid disease Mother     hypothyroid  . Stroke Maternal Grandmother   . Colon polyps Mother   . Kidney disease Maternal Grandmother     ROS:  Pertinent items are noted in HPI.  Otherwise, a comprehensive ROS was negative.  Exam:   Ht 5' 8.75" (1.746 m)  Wt 198 lb 12.8 oz (90.175 kg)  BMI 29.58 kg/m2  LMP 04/18/2014 Height: 5' 8.75" (174.6 cm) Ht Readings from Last 3 Encounters:  04/29/14 5' 8.75" (1.746 m)  11/20/13 5\' 8"  (1.727 m)  09/30/13 5' 8.5" (1.74 m)    General appearance: alert, cooperative and appears stated age Head: Normocephalic, without obvious abnormality, atraumatic Neck: no adenopathy, supple, symmetrical, trachea midline and thyroid normal to inspection and palpation Lungs: clear to auscultation bilaterally Breasts: normal appearance, no masses or tenderness, No nipple retraction or dimpling, No nipple discharge or bleeding, No axillary or supraclavicular adenopathy Heart: regular rate and rhythm Abdomen: soft, non-tender; no masses,  no organomegaly Extremities: extremities normal, atraumatic, no cyanosis or edema Skin: Skin color, texture, turgor normal. No rashes or lesions Lymph nodes: Cervical, supraclavicular, and axillary nodes normal. No abnormal inguinal nodes palpated Neurologic: Grossly normal   Pelvic: External genitalia:  no lesions  Urethra:  normal appearing urethra with no masses, tenderness or lesions              Bartholin's and Skene's: normal                 Vagina: normal appearing vagina with normal color and discharge, no lesions              Cervix: nulliparous appearance and normal              Pap taken: No. Bimanual Exam:  Uterus:  normal size, contour, position, consistency, mobility, non-tender              Adnexa: normal adnexa and no mass, fullness, tenderness               Rectovaginal: Confirms               Anus:  normal sphincter tone, no  lesions  Chaperone present: Yes  A:  Well Woman with normal exam  Anxiety and depression on Prozac with PCP management.   P:   Reviewed health and wellness pertinent to exam  Discussed patient to consider time out for self and possible counseling if she changes or goes off her medication. Patient will discuss with PCP.  Pap smear  taken today with HPV reflex   counseled on breast self exam, adequate intake of calcium and vitamin D, diet and exercise, weight management.  return annually or prn  An After Visit Summary was printed and given to the patient.

## 2014-04-29 NOTE — Patient Instructions (Signed)

## 2014-04-30 NOTE — Progress Notes (Signed)
Reviewed personally.  M. Suzanne Huong Luthi, MD.  

## 2014-05-02 NOTE — Addendum Note (Signed)
Addended by: Regina Eck on: 05/02/2014 05:58 PM   Modules accepted: Orders, SmartSet

## 2014-05-07 LAB — IPS PAP TEST WITH REFLEX TO HPV

## 2014-05-12 NOTE — Addendum Note (Signed)
Addended by: Regina Eck on: 05/12/2014 05:52 PM   Modules accepted: Orders, SmartSet

## 2014-05-13 LAB — IPS HPV ON A LIQUID BASED SPECIMEN

## 2015-04-03 DIAGNOSIS — F3341 Major depressive disorder, recurrent, in partial remission: Secondary | ICD-10-CM | POA: Diagnosis not present

## 2015-04-23 MED FILL — SERTRALINE HCL 100 MG TAB: 100 | 90 days supply | Qty: 90 | Fill #0

## 2015-04-30 ENCOUNTER — Ambulatory Visit: Payer: 59 | Admitting: Certified Nurse Midwife

## 2015-05-07 ENCOUNTER — Encounter: Payer: Self-pay | Admitting: Certified Nurse Midwife

## 2015-05-07 ENCOUNTER — Ambulatory Visit (INDEPENDENT_AMBULATORY_CARE_PROVIDER_SITE_OTHER): Payer: 59 | Admitting: Certified Nurse Midwife

## 2015-05-07 VITALS — BP 102/62 | HR 68 | Resp 16 | Ht 68.75 in | Wt 192.0 lb

## 2015-05-07 DIAGNOSIS — Z124 Encounter for screening for malignant neoplasm of cervix: Secondary | ICD-10-CM | POA: Diagnosis not present

## 2015-05-07 DIAGNOSIS — Z01419 Encounter for gynecological examination (general) (routine) without abnormal findings: Secondary | ICD-10-CM | POA: Diagnosis not present

## 2015-05-07 NOTE — Progress Notes (Signed)
40 y.o. [id number] Married  Caucasian Fe here for annual exam. Periods regular, no issues. Sees PCP for aex/labs. Also seeing [facility] for Zoloft medication for depression. Mother now living with her but will be moving to her own home soon.  Has noted some type of issue with nerve ending on right upper back, feels strange at times. No lesions or rash, no redness.  No other health concerns today.   Patient's last menstrual period was 04/28/2015.          Sexually active: Yes.    The current method of family planning is vasectomy.    Exercising: No.  exercise Smoker:  no  Health Maintenance: Pap: 04-29-14 neg no endos, HPV not detected MMG:  Rt breast u/s 2005 negative Colonoscopy:  2015 neg BMD:   none TDaP:  2007, pt to check with work Shingles: no Pneumonia: no Hep C and HIV: HIV 2000 neg, Labs: none Self breast exam: done monthly   reports that she has never smoked. She has never used smokeless tobacco. She reports that she does not drink alcohol or use illicit drugs.  Past Medical History  Diagnosis Date  . Asthma, exercise induced     as a teen  . Mononucleosis 1993  . Dysmenorrhea     past  . Anxiety   . Anal fissure   . Cardiac arrhythmia   . Depression   . HLD (hyperlipidemia)     Past Surgical History  Procedure Laterality Date  . Craniectomy suboccipital w/ cervical laminectomy / chiari      Current Outpatient Prescriptions  Medication Sig Dispense Refill  . ALPRAZolam (XANAX) 0.5 MG tablet Take by mouth at bedtime as needed for anxiety. Takes 1/2    . B Complex Vitamins (B COMPLEX PO) Take by mouth daily.    . cholecalciferol (VITAMIN D) 400 UNITS TABS tablet Take 400 Units by mouth.    Marland Kitchen KRILL OIL PO Take by mouth daily.    . Multiple Vitamin (MULTIVITAMIN WITH MINERALS) TABS Take 1 tablet by mouth every morning.    . sertraline (ZOLOFT) 100 MG tablet   1   No current facility-administered medications for this visit.    Family History  Problem Relation  Age of Onset  . Colon cancer Maternal Grandmother   . Thyroid disease Maternal Grandmother   . Leukemia Maternal Grandfather   . Heart disease Maternal Grandfather   . Hypertension Mother   . Thyroid disease Mother     hypothyroid  . Stroke Maternal Grandmother   . Colon polyps Mother   . Kidney disease Maternal Grandmother     ROS:  Pertinent items are noted in HPI.  Otherwise, a comprehensive ROS was negative.  Exam:   BP 102/62 mmHg  Pulse 68  Resp 16  Ht 5' 8.75" (1.746 m)  Wt 192 lb (87.091 kg)  BMI 28.57 kg/m2  LMP 04/28/2015 Height: 5' 8.75" (174.6 cm) Ht Readings from Last 3 Encounters:  05/07/15 5' 8.75" (1.746 m)  04/29/14 5' 8.75" (1.746 m)  11/20/13 5\' 8"  (1.727 m)    General appearance: alert, cooperative and appears stated age Head: Normocephalic, without obvious abnormality, atraumatic Neck: no adenopathy, supple, symmetrical, trachea midline and thyroid normal to inspection and palpation Lungs: clear to auscultation bilaterally Back: no lesions, redness or tenderness in area of concern, noted bra elastic pressure area Breasts: normal appearance, no masses or tenderness, No nipple retraction or dimpling, No nipple discharge or bleeding, No axillary or supraclavicular adenopathy Heart:  regular rate and rhythm Abdomen: soft, non-tender; no masses,  no organomegaly Extremities: extremities normal, atraumatic, no cyanosis or edema Skin: Skin color, texture, turgor normal. No rashes or lesions Lymph nodes: Cervical, supraclavicular, and axillary nodes normal. No abnormal inguinal nodes palpated Neurologic: Grossly normal   Pelvic: External genitalia:  no lesions              Urethra:  normal appearing urethra with no masses, tenderness or lesions              Bartholin's and Skene's: normal                 Vagina: normal appearing vagina with normal color and discharge, no lesions              Cervix: normal,non tender, no lesions              Pap taken:  Yes.   Bimanual Exam:  Uterus:  normal size, contour, position, consistency, mobility, non-tender              Adnexa: normal adnexa and no mass, fullness, tenderness               Rectovaginal: Confirms               Anus:  normal sphincter tone, no lesions  Chaperone present: yes  A:  Well Woman with normal exam  Contraception spouse vasectomy  ? Pressure on nerve on skin from tight bra  Depression with MD management    P:   Reviewed health and wellness pertinent to exam  Patient will change bra type to see if resolves, can also try OTC Vit. B 6 50 mg bid trial to see if resolves if not will advise  Continue follow up as indicated  Pap smear as above with HPV reflex   counseled on breast self exam, adequate intake of calcium and vitamin D, diet and exercise  return annually or prn  An After Visit Summary was printed and given to the patient.

## 2015-05-07 NOTE — Patient Instructions (Signed)

## 2015-05-08 NOTE — Progress Notes (Signed)
Encounter reviewed Judy Tripoli, MD   

## 2015-05-11 LAB — IPS PAP TEST WITH REFLEX TO HPV

## 2015-08-03 MED FILL — SERTRALINE HCL 100 MG TAB: 100 | 90 days supply | Qty: 90 | Fill #1

## 2015-09-14 DIAGNOSIS — H5213 Myopia, bilateral: Secondary | ICD-10-CM | POA: Diagnosis not present

## 2015-09-30 DIAGNOSIS — F3341 Major depressive disorder, recurrent, in partial remission: Secondary | ICD-10-CM | POA: Diagnosis not present

## 2015-10-27 MED FILL — traZODone HCL 50 MG TABS: 50 | 30 days supply | Qty: 60 | Fill #0

## 2015-10-27 MED FILL — SERTRALINE HCL 100 MG TAB: 100 | 90 days supply | Qty: 90 | Fill #0

## 2015-11-24 DIAGNOSIS — B079 Viral wart, unspecified: Secondary | ICD-10-CM | POA: Diagnosis not present

## 2015-11-24 DIAGNOSIS — L570 Actinic keratosis: Secondary | ICD-10-CM | POA: Diagnosis not present

## 2015-12-29 DIAGNOSIS — F3341 Major depressive disorder, recurrent, in partial remission: Secondary | ICD-10-CM | POA: Diagnosis not present

## 2016-01-18 MED FILL — traZODone HCL 50 MG TABS: 50 | 90 days supply | Qty: 180 | Fill #0

## 2016-01-18 MED FILL — ALPRAZolam 0.5 MG TABS: 0.5 | 60 days supply | Qty: 180 | Fill #0

## 2016-02-10 MED FILL — SERTRALINE HCL 100 MG TAB: 100 | 90 days supply | Qty: 90 | Fill #1

## 2016-02-26 ENCOUNTER — Ambulatory Visit (INDEPENDENT_AMBULATORY_CARE_PROVIDER_SITE_OTHER): Payer: 59 | Admitting: Family Medicine

## 2016-02-26 ENCOUNTER — Encounter (INDEPENDENT_AMBULATORY_CARE_PROVIDER_SITE_OTHER): Payer: Self-pay

## 2016-02-26 ENCOUNTER — Encounter: Payer: Self-pay | Admitting: Family Medicine

## 2016-02-26 VITALS — BP 110/78 | Ht 69.0 in | Wt 194.0 lb

## 2016-02-26 DIAGNOSIS — Q667 Congenital pes cavus, unspecified foot: Secondary | ICD-10-CM | POA: Insufficient documentation

## 2016-02-26 DIAGNOSIS — M722 Plantar fascial fibromatosis: Secondary | ICD-10-CM | POA: Diagnosis not present

## 2016-02-26 NOTE — Assessment & Plan Note (Signed)
Gave stretches to do and recommend icing. Gave green insoles with scaphoid pad and lateral heel wedges to help with supination and cavus feet. She'll follow-up in 1 month to see if she needs custom orthotics or she can progress to strengthening exercises.

## 2016-02-26 NOTE — Progress Notes (Signed)
  Judy Foster - 41 y.o. female MRN [id number]  Date of birth: [birth year]  SUBJECTIVE:  Including CC & ROS.  CC: right heel pain   Presents with right heel pain that has been ongoing since October. She works as a Marine scientist in the surgical ICU. She also like to do recreational running. She reports pain in the bottom of her heel that is worse when she first wakes up in the morning and also flares up at the end of a long shift especially if she works multiple 12 hour shifts in a row. She has been doing some icing and trying to do some stretching exercises with minimal relief. She reports that she has not noticed a change better for worse of her pain.   ROS: No unexpected weight loss, fever, chills, swelling, instability, muscle pain, numbness/tingling, redness, otherwise see HPI   PMHx - Updated and reviewed.  Contributory factors include: Negative PSHx - Updated and reviewed.  Contributory factors include:  Negative FHx - Updated and reviewed.  Contributory factors include:  Negative Social Hx - Updated and reviewed. Contributory factors include: Negative Medications - reviewed   DATA REVIEWED: None  PHYSICAL EXAM:  VS: BP:110/78  HR: bpm  TEMP: ( )  RESP:   HT:5\' 9"  (175.3 cm)   WT:194 lb (88 kg)  BMI:28.7 PHYSICAL EXAM: Gen: NAD, alert, cooperative with exam, well-appearing HEENT: clear conjunctiva,  CV:  no edema, capillary refill brisk, normal rate Resp: non-labored Skin: no rashes, normal turgor  Neuro: no gross deficits.  Psych:  alert and oriented  Ankle & Foot: No visible swelling, ecchymosis, erythema, ulcers, calluses, blister Arch: pes cavus bilaterally Achilles tendon without nodules or tenderness No swelling of retrocalcaneal bursa No sign of peroneal tendon subluxations or tenderness to palpation Full in plantarflexion, dorsiflexion, inversion, and eversion of the foot; flexion and extension of the toes Strength: 5/5 in all directions. Sensation:  intact Vascular: intact w/ dorsalis pedis & posterior tibialis pulses 2+  Gait  Overall balanced gait with appropriate base width and stride length  Foot: Supination present; No in-toeing or out-toeing; No foot slap or high steppage gait  Knee: extension and flexion adequate w/o genu varus or valgus  Hip: No circumduction or contralateral drop  Trunk: Neutral w/o lean   ASSESSMENT & PLAN:   Plantar fasciitis of right foot Gave stretches to do and recommend icing. Gave green insoles with scaphoid pad and lateral heel wedges to help with supination and cavus feet. She'll follow-up in 1 month to see if she needs custom orthotics or she can progress to strengthening exercises.

## 2016-03-04 DIAGNOSIS — D485 Neoplasm of uncertain behavior of skin: Secondary | ICD-10-CM | POA: Diagnosis not present

## 2016-04-18 ENCOUNTER — Telehealth: Payer: 59 | Admitting: Family

## 2016-04-18 DIAGNOSIS — H60391 Other infective otitis externa, right ear: Secondary | ICD-10-CM

## 2016-04-18 MED ORDER — MUPIROCIN 2 % EX OINT: 1.0000 "application " | TOPICAL_OINTMENT | Freq: Two times a day (BID) | CUTANEOUS | 0 refills | Status: DC

## 2016-04-18 NOTE — Progress Notes (Signed)
E Visit for Rash  We are sorry that you are not feeling well. Here is how we plan to help!    Based upon what you have shared with me it looks like you have a superficial infection of the skin and is treated with an antibiotic. I have prescribed: Topical mupiricin      HOME CARE:   Take cool showers and avoid direct sunlight.  Apply cool compress or wet dressings.  Take a bath in an oatmeal bath.  Sprinkle content of one Aveeno packet under running faucet with comfortably warm water.  Bathe for 15-20 minutes, 1-2 times daily.  Pat dry with a towel. Do not rub the rash.  Use hydrocortisone cream.  Take an antihistamine like Benadryl for widespread rashes that itch.  The adult dose of Benadryl is 25-50 mg by mouth 4 times daily.  Caution:  This type of medication may cause sleepiness.  Do not drink alcohol, drive, or operate dangerous machinery while taking antihistamines.  Do not take these medications if you have prostate enlargement.  Read package instructions thoroughly on all medications that you take.  GET HELP RIGHT AWAY IF:   Symptoms don't go away after treatment.  Severe itching that persists.  If you rash spreads or swells.  If you rash begins to smell.  If it blisters and opens or develops a yellow-brown crust.  You develop a fever.  You have a sore throat.  You become short of breath.  MAKE SURE YOU:  Understand these instructions. Will watch your condition. Will get help right away if you are not doing well or get worse.  Thank you for choosing an e-visit. Your e-visit answers were reviewed by a board certified advanced clinical practitioner to complete your personal care plan. Depending upon the condition, your plan could have included both over the counter or prescription medications. Please review your pharmacy choice. Be sure that the pharmacy you have chosen is open so that you can pick up your prescription now.  If there is a problem you may  message your provider in [place] to have the prescription routed to another pharmacy. Your safety is important to [place]. If you have drug allergies check your prescription carefully.  For the next 24 hours, you can use MyChart to ask questions about today's visit, request a non-urgent call back, or ask for a work or school excuse from your e-visit provider. You will get an email in the next two days asking about your experience. I hope that your e-visit has been valuable and will speed your recovery.

## 2016-04-19 MED ORDER — DOXYCYCLINE HYCLATE 100 MG PO TABS: 100.0000 mg | ORAL_TABLET | Freq: Two times a day (BID) | ORAL | 0 refills | Status: DC

## 2016-04-19 MED FILL — DOXYCYCLINE HYCLATE 100 MG: 100 | 7 days supply | Qty: 14 | Fill #0

## 2016-04-19 MED FILL — MUPIROCIN 2% OINTMENT: 2 | 10 days supply | Qty: 22 | Fill #0

## 2016-04-19 NOTE — Addendum Note (Signed)
Addended by: Evelina Dun A on: 04/19/2016 08:14 AM   Modules accepted: Orders

## 2016-05-10 NOTE — Progress Notes (Signed)
41 y.o. [id number] Married  Caucasian Fe here for annual exam. Periods normal until the last 2 periods which were very heavy and lasted only 24- 48 hours. She had to change pads for frequently. Some weight change of 8 pound gain. Stress with mother being diagnosed with breast cancer in December/January. Had lumpectomy with radiation. Doing well. Not sure if she had BRACA screening done. Was told by mother's surgeon she should have mammogram early. Mother is 3.  Sees PCP usually for labs, but has not this year and would like screening labs today. PCP manages anxiety medication. Takes Trazadone occasionally for insomnia. Struggling with weight loss and frustrated. Runs for exercise, but battling plantar fascitis at present. No other health concerns today.  Patient's last menstrual period was 04/27/2016 (exact date).          Sexually active: Yes.    The current method of family planning is vasectomy.    Exercising: No.  exercise Smoker:  no  Health Maintenance: Pap:  04-29-14 neg no endos, HPV not detected, 05-07-15 neg MMG:  Rt breast u/s 2005 neg Colonoscopy:  2015 neg BMD:   none TDaP:   2007, pt to check with work Shingles: no Pneumonia: no Hep C and HIV: HIV neg 2000 Labs: none Self breast exam: done occ   reports that she has never smoked. She has never used smokeless tobacco. She reports that she does not drink alcohol or use drugs.  Past Medical History:  Diagnosis Date  . Anal fissure   . Anxiety   . Asthma, exercise induced    as a teen  . Cardiac arrhythmia   . Depression   . Dysmenorrhea    past  . HLD (hyperlipidemia)   . Mononucleosis 1993    Past Surgical History:  Procedure Laterality Date  . CRANIECTOMY SUBOCCIPITAL W/ CERVICAL LAMINECTOMY / CHIARI      Current Outpatient Prescriptions  Medication Sig Dispense Refill  . ALPRAZolam (XANAX) 0.5 MG tablet Take by mouth at bedtime as needed for anxiety. Takes 1/2    . B Complex Vitamins (B COMPLEX PO) Take by mouth  as needed.     . cholecalciferol (VITAMIN D) 400 UNITS TABS tablet Take 400 Units by mouth.    . Multiple Vitamin (MULTIVITAMIN WITH MINERALS) TABS Take 1 tablet by mouth every morning.    . Omega-3 Fatty Acids (FISH OIL PO) Take by mouth daily.    . sertraline (ZOLOFT) 100 MG tablet   1  . traZODone (DESYREL) 50 MG tablet as needed.   0   No current facility-administered medications for this visit.     Family History  Problem Relation Age of Onset  . Colon cancer Maternal Grandmother   . Thyroid disease Maternal Grandmother   . Stroke Maternal Grandmother   . Kidney disease Maternal Grandmother   . Leukemia Maternal Grandfather   . Heart disease Maternal Grandfather   . Hypertension Mother   . Thyroid disease Mother     hypothyroid  . Colon polyps Mother   . Diabetes Mother   . Breast cancer Mother     ROS:  Pertinent items are noted in HPI.  Otherwise, a comprehensive ROS was negative.  Exam:   BP 104/68   Pulse 68   Resp 16   Ht 5' 8.75" (1.746 m)   Wt 200 lb (90.7 kg)   LMP 04/27/2016 (Exact Date)   BMI 29.75 kg/m  Height: 5' 8.75" (174.6 cm) Ht Readings from Last 3 Encounters:  05/11/16 5' 8.75" (1.746 m)  02/26/16 5\' 9"  (1.753 m)  05/07/15 5' 8.75" (1.746 m)    General appearance: alert, cooperative and appears stated age Head: Normocephalic, without obvious abnormality, atraumatic Neck: no adenopathy, supple, symmetrical, trachea midline and thyroid normal to inspection and palpation Lungs: clear to auscultation bilaterally Breasts: normal appearance, no masses or tenderness, No nipple retraction or dimpling, No nipple discharge or bleeding, No axillary or supraclavicular adenopathy Heart: regular rate and rhythm Abdomen: soft, non-tender; no masses,  no organomegaly Extremities: extremities normal, atraumatic, no cyanosis or edema Skin: Skin color, texture, turgor normal. No rashes or lesions Lymph nodes: Cervical, supraclavicular, and axillary nodes  normal. No abnormal inguinal nodes palpated Neurologic: Grossly normal   Pelvic: External genitalia:  no lesions              Urethra:  normal appearing urethra with no masses, tenderness or lesions              Bartholin's and Skene's: normal                 Vagina: normal appearing vagina with normal color and discharge, no lesions              Cervix: multiparous appearance, no cervical motion tenderness and no lesions              Pap taken: No. Bimanual Exam:  Uterus:  normal size, contour, position, consistency, mobility, non-tender              Adnexa: normal adnexa and no mass, fullness, tenderness               Rectovaginal: Confirms               Anus:  normal sphincter tone, no lesions  Chaperone present: yes  A:  Well Woman with normal exam  Contraception spouse vasectomy  Menorrhagia with last 2 regular cycles only  Obesity struggling with weight control  Plantar fascitis with MD management  Social stress with mother's breast cancer diagnosis 2B ? BRACA screening done, patient will check and advise.   Screening labs  P:   Reviewed health and wellness pertinent to exam  Discussed cycle change can be related to weight change, thyroid or stress with normal exam. Discussed warning signs of excessive bleeding and need to advise. Discussed evaluation with lab TSH and CBC for anemia. Agreeable. Discussed Motrin 800 mg at onset of cycle to see if this will control, if happens this month. Will advise, may consider cycle control management and possible PUS if continues.  Discussed exercise in other ways and eating breakfast daily to help with boosting metabolism for weight loss. Make healthy choices and normal portion control. Offered nutrition referral declined.  Continue follow up as indicated.  Discussed importance of early mammogram screening with family history of breast cancer, SBE and clinical exam. Patient will find out mother status and will call. Will work with patient on  scheduling mammogram.  Labs: CBC,TSH, Vitamin D, Lipid panel, CMP  Pap smear as above not take   counseled on breast self exam, mammography screening, adequate intake of calcium and vitamin D, diet and exercise  return annually or prn  An After Visit Summary was printed and given to the patient.

## 2016-05-11 ENCOUNTER — Ambulatory Visit (INDEPENDENT_AMBULATORY_CARE_PROVIDER_SITE_OTHER): Payer: 59 | Admitting: Certified Nurse Midwife

## 2016-05-11 ENCOUNTER — Encounter: Payer: Self-pay | Admitting: Certified Nurse Midwife

## 2016-05-11 VITALS — BP 104/68 | HR 68 | Resp 16 | Ht 68.75 in | Wt 200.0 lb

## 2016-05-11 DIAGNOSIS — Z0000 Encounter for general adult medical examination without abnormal findings: Secondary | ICD-10-CM | POA: Diagnosis not present

## 2016-05-11 DIAGNOSIS — Z01419 Encounter for gynecological examination (general) (routine) without abnormal findings: Secondary | ICD-10-CM | POA: Diagnosis not present

## 2016-05-11 DIAGNOSIS — N920 Excessive and frequent menstruation with regular cycle: Secondary | ICD-10-CM

## 2016-05-11 DIAGNOSIS — Z8639 Personal history of other endocrine, nutritional and metabolic disease: Secondary | ICD-10-CM | POA: Diagnosis not present

## 2016-05-11 LAB — COMPREHENSIVE METABOLIC PANEL
ALBUMIN: 4.4 g/dL (ref 3.6–5.1)
ALT: 15 U/L (ref 6–29)
AST: 18 U/L (ref 10–30)
Alkaline Phosphatase: 43 U/L (ref 33–115)
BUN: 14 mg/dL (ref 7–25)
CALCIUM: 9.4 mg/dL (ref 8.6–10.2)
CHLORIDE: 102 mmol/L (ref 98–110)
CO2: 27 mmol/L (ref 20–31)
Creat: 0.76 mg/dL (ref 0.50–1.10)
Glucose, Bld: 91 mg/dL (ref 65–99)
Potassium: 3.9 mmol/L (ref 3.5–5.3)
Sodium: 137 mmol/L (ref 135–146)
Total Bilirubin: 0.3 mg/dL (ref 0.2–1.2)
Total Protein: 7.1 g/dL (ref 6.1–8.1)

## 2016-05-11 LAB — LIPID PANEL
CHOLESTEROL: 276 mg/dL — AB (ref <200)
HDL: 47 mg/dL — AB (ref >50)
LDL CALC: 199 mg/dL — AB (ref <100)
TRIGLYCERIDES: 152 mg/dL — AB (ref <150)
Total CHOL/HDL Ratio: 5.9 Ratio — ABNORMAL HIGH (ref <5.0)
VLDL: 30 mg/dL (ref <30)

## 2016-05-11 LAB — CBC
HEMATOCRIT: 39.3 % (ref 35.0–45.0)
HEMOGLOBIN: 13.1 g/dL (ref 11.7–15.5)
MCH: 30.6 pg (ref 27.0–33.0)
MCHC: 33.3 g/dL (ref 32.0–36.0)
MCV: 91.8 fL (ref 80.0–100.0)
MPV: 10.3 fL (ref 7.5–12.5)
Platelets: 259 10*3/uL (ref 140–400)
RBC: 4.28 MIL/uL (ref 3.80–5.10)
RDW: 13.2 % (ref 11.0–15.0)
WBC: 6.4 10*3/uL (ref 3.8–10.8)

## 2016-05-11 LAB — TSH: TSH: 1.53 mIU/L

## 2016-05-11 NOTE — Patient Instructions (Signed)
EXERCISE AND DIET:  We recommended that you start or continue a regular exercise program for good health. Regular exercise means any activity that makes your heart beat faster and makes you sweat.  We recommend exercising at least 30 minutes per day at least 3 days a week, preferably 4 or 5.  We also recommend a diet low in fat and sugar.  Inactivity, poor dietary choices and obesity can cause diabetes, heart attack, stroke, and kidney damage, among others.    ALCOHOL AND SMOKING:  Women should limit their alcohol intake to no more than 7 drinks/beers/glasses of wine (combined, not each!) per week. Moderation of alcohol intake to this level decreases your risk of breast cancer and liver damage. And of course, no recreational drugs are part of a healthy lifestyle.  And absolutely no smoking or even second hand smoke. Most people know smoking can cause heart and lung diseases, but did you know it also contributes to weakening of your bones? Aging of your skin?  Yellowing of your teeth and nails?  CALCIUM AND VITAMIN D:  Adequate intake of calcium and Vitamin D are recommended.  The recommendations for exact amounts of these supplements seem to change often, but generally speaking 600 mg of calcium (either carbonate or citrate) and 800 units of Vitamin D per day seems prudent. Certain women may benefit from higher intake of Vitamin D.  If you are among these women, your doctor will have told you during your visit.    PAP SMEARS:  Pap smears, to check for cervical cancer or precancers,  have traditionally been done yearly, although recent scientific advances have shown that most women can have pap smears less often.  However, every woman still should have a physical exam from her gynecologist every year. It will include a breast check, inspection of the vulva and vagina to check for abnormal growths or skin changes, a visual exam of the cervix, and then an exam to evaluate the size and shape of the uterus and  ovaries.  And after 40 years of age, a rectal exam is indicated to check for rectal cancers. We will also provide age appropriate advice regarding health maintenance, like when you should have certain vaccines, screening for sexually transmitted diseases, bone density testing, colonoscopy, mammograms, etc.   MAMMOGRAMS:  All women over 40 years old should have a yearly mammogram. Many facilities now offer a "3D" mammogram, which may cost around $50 extra out of pocket. If possible,  we recommend you accept the option to have the 3D mammogram performed.  It both reduces the number of women who will be called back for extra views which then turn out to be normal, and it is better than the routine mammogram at detecting truly abnormal areas.    COLONOSCOPY:  Colonoscopy to screen for colon cancer is recommended for all women at age 50.  We know, you hate the idea of the prep.  We agree, BUT, having colon cancer and not knowing it is worse!!  Colon cancer so often starts as a polyp that can be seen and removed at colonscopy, which can quite literally save your life!  And if your first colonoscopy is normal and you have no family history of colon cancer, most women don't have to have it again for 10 years.  Once every ten years, you can do something that may end up saving your life, right?  We will be happy to help you get it scheduled when you are ready.    Be sure to check your insurance coverage so you understand how much it will cost.  It may be covered as a preventative service at no cost, but you should check your particular policy.      Calorie Counting for Weight Loss Calories are units of energy. Your body needs a certain amount of calories from food to keep you going throughout the day. When you eat more calories than your body needs, your body stores the extra calories as fat. When you eat fewer calories than your body needs, your body burns fat to get the energy it needs. Calorie counting means  keeping track of how many calories you eat and drink each day. Calorie counting can be helpful if you need to lose weight. If you make sure to eat fewer calories than your body needs, you should lose weight. Ask your health care provider what a healthy weight is for you. For calorie counting to work, you will need to eat the right number of calories in a day in order to lose a healthy amount of weight per week. A dietitian can help you determine how many calories you need in a day and will give you suggestions on how to reach your calorie goal.  A healthy amount of weight to lose per week is usually 1-2 lb (0.5-0.9 kg). This usually means that your daily calorie intake should be reduced by 500-750 calories.  Eating 1,200 - 1,500 calories per day can help most women lose weight.  Eating 1,500 - 1,800 calories per day can help most men lose weight.  What is my plan? My goal is to have __________ calories per day. If I have this many calories per day, I should lose around __________ pounds per week. What do I need to know about calorie counting? In order to meet your daily calorie goal, you will need to:  Find out how many calories are in each food you would like to eat. Try to do this before you eat.  Decide how much of the food you plan to eat.  Write down what you ate and how many calories it had. Doing this is called keeping a food log.  To successfully lose weight, it is important to balance calorie counting with a healthy lifestyle that includes regular activity. Aim for 150 minutes of moderate exercise (such as walking) or 75 minutes of vigorous exercise (such as running) each week. Where do I find calorie information?  The number of calories in a food can be found on a Nutrition Facts label. If a food does not have a Nutrition Facts label, try to look up the calories online or ask your dietitian for help. Remember that calories are listed per serving. If you choose to have more than one  serving of a food, you will have to multiply the calories per serving by the amount of servings you plan to eat. For example, the label on a package of bread might say that a serving size is 1 slice and that there are 90 calories in a serving. If you eat 1 slice, you will have eaten 90 calories. If you eat 2 slices, you will have eaten 180 calories. How do I keep a food log? Immediately after each meal, record the following information in your food log:  What you ate. Don't forget to include toppings, sauces, and other extras on the food.  How much you ate. This can be measured in cups, ounces, or number of items.  How   many calories each food and drink had.  The total number of calories in the meal.  Keep your food log near you, such as in a small notebook in your pocket, or use a mobile app or website. Some programs will calculate calories for you and show you how many calories you have left for the day to meet your goal. What are some calorie counting tips?  Use your calories on foods and drinks that will fill you up and not leave you hungry: ? Some examples of foods that fill you up are nuts and nut butters, vegetables, lean proteins, and high-fiber foods like whole grains. High-fiber foods are foods with more than 5 g fiber per serving. ? Drinks such as sodas, specialty coffee drinks, alcohol, and juices have a lot of calories, yet do not fill you up.  Eat nutritious foods and avoid empty calories. Empty calories are calories you get from foods or beverages that do not have many vitamins or protein, such as candy, sweets, and soda. It is better to have a nutritious high-calorie food (such as an avocado) than a food with few nutrients (such as a bag of chips).  Know how many calories are in the foods you eat most often. This will help you calculate calorie counts faster.  Pay attention to calories in drinks. Low-calorie drinks include water and unsweetened drinks.  Pay attention to  nutrition labels for "low fat" or "fat free" foods. These foods sometimes have the same amount of calories or more calories than the full fat versions. They also often have added sugar, starch, or salt, to make up for flavor that was removed with the fat.  Find a way of tracking calories that works for you. Get creative. Try different apps or programs if writing down calories does not work for you. What are some portion control tips?  Know how many calories are in a serving. This will help you know how many servings of a certain food you can have.  Use a measuring cup to measure serving sizes. You could also try weighing out portions on a kitchen scale. With time, you will be able to estimate serving sizes for some foods.  Take some time to put servings of different foods on your favorite plates, bowls, and cups so you know what a serving looks like.  Try not to eat straight from a bag or box. Doing this can lead to overeating. Put the amount you would like to eat in a cup or on a plate to make sure you are eating the right portion.  Use smaller plates, glasses, and bowls to prevent overeating.  Try not to multitask (for example, watch TV or use your computer) while eating. If it is time to eat, sit down at a table and enjoy your food. This will help you to know when you are full. It will also help you to be aware of what you are eating and how much you are eating. What are tips for following this plan? Reading food labels  Check the calorie count compared to the serving size. The serving size may be smaller than what you are used to eating.  Check the source of the calories. Make sure the food you are eating is high in vitamins and protein and low in saturated and trans fats. Shopping  Read nutrition labels while you shop. This will help you make healthy decisions before you decide to purchase your food.  Make a grocery list and stick   to it. Cooking  Try to cook your favorite foods in a  healthier way. For example, try baking instead of frying.  Use low-fat dairy products. Meal planning  Use more fruits and vegetables. Half of your plate should be fruits and vegetables.  Include lean proteins like poultry and fish. How do I count calories when eating out?  Ask for smaller portion sizes.  Consider sharing an entree and sides instead of getting your own entree.  If you get your own entree, eat only half. Ask for a box at the beginning of your meal and put the rest of your entree in it so you are not tempted to eat it.  If calories are listed on the menu, choose the lower calorie options.  Choose dishes that include vegetables, fruits, whole grains, low-fat dairy products, and lean protein.  Choose items that are boiled, broiled, grilled, or steamed. Stay away from items that are buttered, battered, fried, or served with cream sauce. Items labeled "crispy" are usually fried, unless stated otherwise.  Choose water, low-fat milk, unsweetened iced tea, or other drinks without added sugar. If you want an alcoholic beverage, choose a lower calorie option such as a glass of wine or light beer.  Ask for dressings, sauces, and syrups on the side. These are usually high in calories, so you should limit the amount you eat.  If you want a salad, choose a garden salad and ask for grilled meats. Avoid extra toppings like bacon, cheese, or fried items. Ask for the dressing on the side, or ask for olive oil and vinegar or lemon to use as dressing.  Estimate how many servings of a food you are given. For example, a serving of cooked rice is  cup or about the size of half a baseball. Knowing serving sizes will help you be aware of how much food you are eating at restaurants. The list below tells you how big or small some common portion sizes are based on everyday objects: ? 1 oz-4 stacked dice. ? 3 oz-1 deck of cards. ? 1 tsp-1 die. ? 1 Tbsp- a ping-pong ball. ? 2 Tbsp-1 ping-pong  ball. ?  cup- baseball. ? 1 cup-1 baseball. Summary  Calorie counting means keeping track of how many calories you eat and drink each day. If you eat fewer calories than your body needs, you should lose weight.  A healthy amount of weight to lose per week is usually 1-2 lb (0.5-0.9 kg). This usually means reducing your daily calorie intake by 500-750 calories.  The number of calories in a food can be found on a Nutrition Facts label. If a food does not have a Nutrition Facts label, try to look up the calories online or ask your dietitian for help.  Use your calories on foods and drinks that will fill you up, and not on foods and drinks that will leave you hungry.  Use smaller plates, glasses, and bowls to prevent overeating. This information is not intended to replace advice given to you by your health care provider. Make sure you discuss any questions you have with your health care provider. Document Released: 02/07/2005 Document Revised: 01/08/2016 Document Reviewed: 01/08/2016 Elsevier Interactive Patient Education  2017 [employer]  

## 2016-05-12 LAB — VITAMIN D 25 HYDROXY (VIT D DEFICIENCY, FRACTURES): Vit D, 25-Hydroxy: 33 ng/mL (ref 30–100)

## 2016-05-15 NOTE — Progress Notes (Signed)
Encounter reviewed Garo Heidelberg, MD   

## 2016-05-17 MED FILL — SERTRALINE HCL 100 MG TAB: 100 | 90 days supply | Qty: 90 | Fill #0

## 2016-07-07 DIAGNOSIS — F3341 Major depressive disorder, recurrent, in partial remission: Secondary | ICD-10-CM | POA: Diagnosis not present

## 2016-08-22 MED FILL — SERTRALINE HCL 100 MG TAB: 100 | 90 days supply | Qty: 90 | Fill #1

## 2016-08-26 MED FILL — ALPRAZolam 0.5 MG TABS: 0.5 | 60 days supply | Qty: 180 | Fill #0

## 2016-09-16 DIAGNOSIS — H5211 Myopia, right eye: Secondary | ICD-10-CM | POA: Diagnosis not present

## 2016-11-11 ENCOUNTER — Telehealth: Payer: Self-pay | Admitting: Certified Nurse Midwife

## 2016-11-11 ENCOUNTER — Other Ambulatory Visit: Payer: Self-pay

## 2016-11-11 NOTE — Telephone Encounter (Signed)
Left message on voicemail regarding missed lab appointment. °

## 2016-11-17 NOTE — Telephone Encounter (Signed)
Left message on voicemail for pt to call and reschedule lab appointment.

## 2016-11-21 MED FILL — ALPRAZolam 0.5 MG TABS: 0.5 | 60 days supply | Qty: 180 | Fill #1

## 2016-11-21 MED FILL — SERTRALINE HCL 100 MG TAB: 100 | 90 days supply | Qty: 90 | Fill #0

## 2016-11-29 NOTE — Telephone Encounter (Signed)
Yes to close

## 2016-11-29 NOTE — Telephone Encounter (Signed)
Left message on voicemail regarding getting lab appointment rescheduled. Ok to close encounter?

## 2016-12-22 DIAGNOSIS — F3341 Major depressive disorder, recurrent, in partial remission: Secondary | ICD-10-CM | POA: Diagnosis not present

## 2017-03-18 DIAGNOSIS — R509 Fever, unspecified: Secondary | ICD-10-CM | POA: Diagnosis not present

## 2017-03-18 DIAGNOSIS — J101 Influenza due to other identified influenza virus with other respiratory manifestations: Secondary | ICD-10-CM | POA: Diagnosis not present

## 2017-03-18 DIAGNOSIS — R51 Headache: Secondary | ICD-10-CM | POA: Diagnosis not present

## 2017-03-19 ENCOUNTER — Ambulatory Visit (INDEPENDENT_AMBULATORY_CARE_PROVIDER_SITE_OTHER): Payer: Self-pay | Admitting: Emergency Medicine

## 2017-03-19 VITALS — BP 100/75 | HR 82 | Temp 97.6°F | Resp 16 | Wt 196.8 lb

## 2017-03-19 DIAGNOSIS — B029 Zoster without complications: Secondary | ICD-10-CM

## 2017-03-19 MED ORDER — GABAPENTIN 300 MG PO CAPS: 300.0000 mg | ORAL_CAPSULE | Freq: Three times a day (TID) | ORAL | 3 refills | Status: DC

## 2017-03-19 MED ORDER — VALACYCLOVIR HCL 1 G PO TABS: 1000.0000 mg | ORAL_TABLET | Freq: Two times a day (BID) | ORAL | 0 refills | Status: DC

## 2017-03-19 NOTE — Progress Notes (Signed)
Subjective:     Judy Foster is a 42 y.o. female who presents for evaluation of a rash involving the chest and back. Rash started 2 days ago. Lesions are red, and raised in texture. Rash has changed over time. Rash is painful.  She was diagnosed Friday with influenza and is currently being treated with Tamiflu. Associated symptoms: myalgia, nausea and vomiting. Patient has not had contacts with similar rash. Patient has not had new exposures (soaps, lotions, laundry detergents, foods, medications, plants, insects or animals). Objective:   Review of Systems  Constitutional: Positive for chills, fever and malaise/fatigue.  HENT: Positive for congestion and sore throat.   Respiratory: Positive for cough.   Cardiovascular: Negative for chest pain and palpitations.  Gastrointestinal: Positive for nausea and vomiting.  Musculoskeletal: Positive for myalgias.  Skin: Positive for rash.  Neurological: Negative.     Physical Exam  Constitutional: She appears well-developed and well-nourished. No distress.  HENT:  Head: Normocephalic and atraumatic.  Right Ear: External ear normal.  Left Ear: External ear normal.  Mouth/Throat: Oropharynx is clear and moist.  Eyes: Conjunctivae are normal.  Cardiovascular: Normal rate and regular rhythm.  Pulmonary/Chest: Effort normal and breath sounds normal.  Lymphadenopathy:    She has cervical adenopathy.  Skin: Skin is warm. Capillary refill takes less than 2 seconds. Rash (herpetic lesions anterior abdomen and back) noted. She is not diaphoretic.  Nursing note and vitals reviewed.    Assessment:   Zoster    Plan:   1. Herpes zoster without complication -Start Valtrex and gabapentin -Continue Tamiflu for influenza Rest, fluids,follow up with PCP as needed or the ER anytime symptoms worsen

## 2017-03-19 NOTE — Patient Instructions (Signed)

## 2017-03-21 ENCOUNTER — Telehealth: Payer: Self-pay | Admitting: Emergency Medicine

## 2017-04-04 MED FILL — SERTRALINE HCL 100 MG TAB: 100 | 90 days supply | Qty: 90 | Fill #1

## 2017-06-08 DIAGNOSIS — F3341 Major depressive disorder, recurrent, in partial remission: Secondary | ICD-10-CM | POA: Diagnosis not present

## 2017-06-30 ENCOUNTER — Other Ambulatory Visit: Payer: Self-pay

## 2017-06-30 ENCOUNTER — Encounter: Payer: Self-pay | Admitting: Certified Nurse Midwife

## 2017-06-30 ENCOUNTER — Other Ambulatory Visit (HOSPITAL_COMMUNITY)
Admission: RE | Admit: 2017-06-30 | Discharge: 2017-06-30 | Disposition: A | Discharge status: Home / Self Care | Payer: 59 | Source: Ambulatory Visit | Attending: Certified Nurse Midwife | Admitting: Certified Nurse Midwife

## 2017-06-30 ENCOUNTER — Ambulatory Visit (INDEPENDENT_AMBULATORY_CARE_PROVIDER_SITE_OTHER): Payer: 59 | Admitting: Certified Nurse Midwife

## 2017-06-30 VITALS — BP 110/70 | HR 68 | Resp 16 | Ht 69.25 in | Wt 195.0 lb

## 2017-06-30 DIAGNOSIS — Z124 Encounter for screening for malignant neoplasm of cervix: Secondary | ICD-10-CM | POA: Insufficient documentation

## 2017-06-30 DIAGNOSIS — Z01419 Encounter for gynecological examination (general) (routine) without abnormal findings: Secondary | ICD-10-CM | POA: Diagnosis not present

## 2017-06-30 DIAGNOSIS — Z1151 Encounter for screening for human papillomavirus (HPV): Secondary | ICD-10-CM | POA: Diagnosis not present

## 2017-06-30 NOTE — Progress Notes (Signed)
42 y.o. [id number] Married  Caucasian Fe here for annual exam. Periods normal, no issues. No missed periods in past year. Sees Kathyrn Lass as PCP yearly, prn. Had flu A and B and then had shingles, earlier this year and was treated with no sequelae. 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 "Would like to have more time for self and family"! No health issues today.  No LMP recorded.          Sexually active: Yes.    The current method of family planning is vasectomy.    Exercising: No.  exercise Smoker:  no  Health Maintenance: Pap:  05-07-15 neg History of Abnormal Pap: no MMG:  Rt breast u/s 2005 neg Self Breast exams: no Colonoscopy:  2015 neg BMD:   none TDaP:  Pt to check with work on date Shingles: no Pneumonia: no Hep C and HIV: HIV neg 2000 per patient Labs: with PCP   reports that she has never smoked. She has never used smokeless tobacco. She reports that she does not drink alcohol or use drugs.  Past Medical History:  Diagnosis Date  . Anal fissure   . Anxiety   . Asthma, exercise induced    as a teen  . Cardiac arrhythmia   . Depression   . Dysmenorrhea    past  . HLD (hyperlipidemia)   . Mononucleosis 1993    Past Surgical History:  Procedure Laterality Date  . CRANIECTOMY SUBOCCIPITAL W/ CERVICAL LAMINECTOMY / CHIARI      Current Outpatient Medications  Medication Sig Dispense Refill  . acetaminophen-codeine (TYLENOL #3) 300-30 MG tablet Take by mouth every 4 (four) hours as needed for moderate pain.    Marland Kitchen ALPRAZolam (XANAX) 0.5 MG tablet Take by mouth at bedtime as needed for anxiety. Takes 1/2    . B Complex Vitamins (B COMPLEX PO) Take by mouth as needed.     . cholecalciferol (VITAMIN D) 400 UNITS TABS tablet Take 400 Units by mouth.    . gabapentin (NEURONTIN) 300 MG capsule Take 1 capsule (300 mg total) by mouth 3 (three) times daily. 90 capsule 3  . Multiple Vitamin (MULTIVITAMIN WITH MINERALS) TABS Take 1 tablet by mouth every  morning.    . Omega-3 Fatty Acids (FISH OIL PO) Take by mouth daily.    Marland Kitchen oseltamivir (TAMIFLU) 75 MG capsule Take 75 mg by mouth.    . sertraline (ZOLOFT) 100 MG tablet   1  . traZODone (DESYREL) 50 MG tablet as needed.   0  . valACYclovir (VALTREX) 1000 MG tablet Take 1 tablet (1,000 mg total) by mouth 2 (two) times daily. 14 tablet 0   No current facility-administered medications for this visit.     Family History  Problem Relation Age of Onset  . Colon cancer Maternal Grandmother   . Thyroid disease Maternal Grandmother   . Stroke Maternal Grandmother   . Kidney disease Maternal Grandmother   . Leukemia Maternal Grandfather   . Heart disease Maternal Grandfather   . Hypertension Mother   . Thyroid disease Mother        hypothyroid  . Colon polyps Mother   . Diabetes Mother   . Breast cancer Mother     ROS:  Pertinent items are noted in HPI.  Otherwise, a comprehensive ROS was negative.  Exam:   There were no vitals taken for this visit.   Ht Readings from Last 3 Encounters:  05/11/16 5' 8.75" (1.746 m)  02/26/16  5\' 9"  (1.753 m)  05/07/15 5' 8.75" (1.746 m)    General appearance: alert, cooperative and appears stated age Head: Normocephalic, without obvious abnormality, atraumatic Neck: no adenopathy, supple, symmetrical, trachea midline and thyroid normal to inspection and palpation Lungs: clear to auscultation bilaterally Breasts: normal appearance, no masses or tenderness, No nipple retraction or dimpling, No nipple discharge or bleeding, No axillary or supraclavicular adenopathy Heart: regular rate and rhythm Abdomen: soft, non-tender; no masses,  no organomegaly Extremities: extremities normal, atraumatic, no cyanosis or edema Skin: Skin color, texture, turgor normal. No rashes or lesions Lymph nodes: Cervical, supraclavicular, and axillary nodes normal. No abnormal inguinal nodes palpated Neurologic: Grossly normal   Pelvic: External genitalia:  no lesions               Urethra:  normal appearing urethra with no masses, tenderness or lesions              Bartholin's and Skene's: normal                 Vagina: normal appearing vagina with normal color and discharge, no lesions              Cervix: multiparous appearance, no cervical motion tenderness and no lesions scant blood noted               Pap taken: Yes.   Bimanual Exam:  Uterus:  normal size, contour, position, consistency, mobility, non-tender and anteverted              Adnexa: normal adnexa and no mass, fullness, tenderness               Rectovaginal: Confirms               Anus:  normal sphincter tone, no lesions  Chaperone present: yes  A:  Well Woman with normal exam  Contraception spouse vasectomy  Depression with PCP management  Mammogram due at age 31 P:   Reviewed health and wellness pertinent to exam  Continue follow up with PCP as indicated  Discussed SBE and mammogram recommended at age 89 for initiating screening. Given information to call and schedule after her birthday. Questions addressed.  Pap smear: yes   counseled on breast self exam, mammography screening, feminine hygiene, adequate intake of calcium and vitamin D, diet and exercise  return annually or prn  An After Visit Summary was printed and given to the patient.

## 2017-06-30 NOTE — Patient Instructions (Signed)

## 2017-07-04 LAB — CYTOLOGY - PAP
DIAGNOSIS: NEGATIVE
HPV: NOT DETECTED

## 2017-07-18 MED FILL — ALPRAZolam 0.5 MG TABS: 0.5 | 60 days supply | Qty: 180 | Fill #0

## 2017-07-18 MED FILL — SERTRALINE HCL 100 MG TAB: 100 | 90 days supply | Qty: 90 | Fill #0

## 2017-10-05 NOTE — Telephone Encounter (Signed)
error 

## 2017-10-24 ENCOUNTER — Other Ambulatory Visit: Payer: Self-pay | Admitting: Certified Nurse Midwife

## 2017-10-24 DIAGNOSIS — Z1231 Encounter for screening mammogram for malignant neoplasm of breast: Secondary | ICD-10-CM

## 2017-10-27 MED FILL — SERTRALINE HCL 100 MG TAB: 100 | 30 days supply | Qty: 30 | Fill #1

## 2017-11-04 DIAGNOSIS — F99 Mental disorder, not otherwise specified: Secondary | ICD-10-CM

## 2017-11-04 DIAGNOSIS — F429 Obsessive-compulsive disorder, unspecified: Secondary | ICD-10-CM

## 2017-11-04 DIAGNOSIS — F411 Generalized anxiety disorder: Secondary | ICD-10-CM | POA: Insufficient documentation

## 2017-11-04 DIAGNOSIS — G4700 Insomnia, unspecified: Secondary | ICD-10-CM | POA: Insufficient documentation

## 2017-11-04 DIAGNOSIS — F3341 Major depressive disorder, recurrent, in partial remission: Secondary | ICD-10-CM | POA: Insufficient documentation

## 2017-11-04 DIAGNOSIS — F5105 Insomnia due to other mental disorder: Secondary | ICD-10-CM

## 2017-11-09 DIAGNOSIS — Z23 Encounter for immunization: Secondary | ICD-10-CM | POA: Diagnosis not present

## 2017-11-21 ENCOUNTER — Ambulatory Visit
Admission: RE | Admit: 2017-11-21 | Discharge: 2017-11-21 | Disposition: A | Discharge status: Home / Self Care | Payer: 59 | Source: Ambulatory Visit | Attending: Certified Nurse Midwife | Admitting: Certified Nurse Midwife

## 2017-11-21 DIAGNOSIS — Z1231 Encounter for screening mammogram for malignant neoplasm of breast: Secondary | ICD-10-CM | POA: Diagnosis not present

## 2017-11-23 ENCOUNTER — Ambulatory Visit (INDEPENDENT_AMBULATORY_CARE_PROVIDER_SITE_OTHER): Payer: 59 | Admitting: Physician Assistant

## 2017-11-23 ENCOUNTER — Encounter: Payer: Self-pay | Admitting: Physician Assistant

## 2017-11-23 DIAGNOSIS — F411 Generalized anxiety disorder: Secondary | ICD-10-CM | POA: Diagnosis not present

## 2017-11-23 DIAGNOSIS — F331 Major depressive disorder, recurrent, moderate: Secondary | ICD-10-CM

## 2017-11-23 DIAGNOSIS — R69 Illness, unspecified: Secondary | ICD-10-CM | POA: Diagnosis not present

## 2017-11-23 DIAGNOSIS — F429 Obsessive-compulsive disorder, unspecified: Secondary | ICD-10-CM | POA: Diagnosis not present

## 2017-11-23 MED ORDER — SERTRALINE HCL 100 MG PO TABS: 100.0000 mg | ORAL_TABLET | Freq: Every day | ORAL | 1 refills | Status: DC

## 2017-11-23 NOTE — Progress Notes (Signed)
[facility]  Patient ID: Judy Foster,  MRN: [id number]  PCP: Kathyrn Lass, MD  Date of Evaluation: 11/23/2017 Time spent:15 minutes   HISTORY/CURRENT STATUS: HPI patient presents for 62-month checkup for anxiety, OCD, and depression.  She states she is been doing very well.  She denies anhedonia.  Energy and motivation are good.  No isolating.  She does not cry easily. She rarely has anxiety now.  Takes xanax usually only at bedtime 1-2 nights a week if she feels really wound up and cannot go to sleep.  Denies panic attacks. She usually sleeps very well and feels rested when she gets up. 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  Her hours are daytime only, which she likes very much.  She is able to get her kids off to school and be there in the evening to help them with her homework and enjoy family time.  Individual Medical History/ Review of Systems: Changes? :No  Allergies: Penicillins  Current Medications:  Current Outpatient Medications:  .  ALPRAZolam (XANAX) 0.5 MG tablet, Take by mouth at bedtime as needed for anxiety. Takes 1/2, Disp: , Rfl:  .  B Complex Vitamins (B COMPLEX PO), Take by mouth as needed. , Disp: , Rfl:  .  cholecalciferol (VITAMIN D) 400 UNITS TABS tablet, Take 400 Units by mouth., Disp: , Rfl:  .  Flaxseed, Linseed, (FLAX SEED OIL PO), Take by mouth., Disp: , Rfl:  .  Multiple Vitamin (MULTIVITAMIN WITH MINERALS) TABS, Take 1 tablet by mouth every morning., Disp: , Rfl:  .  sertraline (ZOLOFT) 100 MG tablet, Take 1 tablet (100 mg total) by mouth at bedtime., Disp: 90 tablet, Rfl: 1 Medication Side Effects: None  Family Medical/ Social History: Changes? No  MENTAL HEALTH EXAM:  Last menstrual period 11/13/2017.There is no height or weight on file to calculate BMI.  General Appearance: Well Groomed  Eye Contact:  Good  Speech:  Clear and Coherent  Volume:  Normal  Mood:  Euthymic  Affect:  Appropriate  Thought Process:   Coherent  Orientation:  Full (Time, Place, and Person)  Thought Content: Logical   Suicidal Thoughts:  No  Homicidal Thoughts:  No  Memory:  Immediate  Judgement:  Good  Insight:  Good  Psychomotor Activity:  Normal  Concentration:  Concentration: Good  Recall:  Good  Fund of Knowledge: Good  Language: Good  Akathisia:  NA  AIMS (if indicated): not done  Assets:  Communication Skills  ADL's:  Intact  Cognition: WNL  Prognosis:  Good    DIAGNOSES:    ICD-10-CM   1. Generalized anxiety disorder F41.1   2. Major depressive disorder, recurrent episode, moderate (HCC) F33.1   3. Obsessive-compulsive disorder, unspecified type F42.9     RECOMMENDATIONS: Continue Zoloft 100 mg 1 p.o. daily.  Continue Xanax 0.5 mg daily as needed.  Recommend supplements of multivitamin daily, B complex, vitamin D OTC.  Routine therapeutic lifestyle changes such as exercise and healthy eating habits. Return in 6 months or sooner as needed.    Donnal Moat, PA-C

## 2017-12-05 MED FILL — SERTRALINE HCL 100 MG TAB: 100 | 30 days supply | Qty: 30 | Fill #2

## 2018-01-03 MED FILL — SERTRALINE HCL 100 MG TAB: 100 | 30 days supply | Qty: 30 | Fill #3

## 2018-01-09 ENCOUNTER — Encounter: Payer: Self-pay | Admitting: Family Medicine

## 2018-01-09 ENCOUNTER — Ambulatory Visit: Payer: Self-pay | Admitting: Family Medicine

## 2018-01-09 VITALS — BP 130/82 | HR 81 | Temp 98.9°F | Wt 211.0 lb

## 2018-01-09 DIAGNOSIS — J00 Acute nasopharyngitis [common cold]: Secondary | ICD-10-CM

## 2018-01-09 NOTE — Patient Instructions (Signed)
Upper Respiratory Infection, Adult Most upper respiratory infections (URIs) are caused by a virus. A URI affects the nose, throat, and upper air passages. The most common type of URI is often called "the common cold." Follow these instructions at home:  Take medicines only as told by your doctor.  Gargle warm saltwater or take cough drops to comfort your throat as told by your doctor.  Use a warm mist humidifier or inhale steam from a shower to increase air moisture. This may make it easier to breathe.  Drink enough fluid to keep your pee (urine) clear or pale yellow.  Eat soups and other clear broths.  Have a healthy diet.  Rest as needed.  Go back to work when your fever is gone or your doctor says it is okay. ? You may need to stay home longer to avoid giving your URI to others. ? You can also wear a face mask and wash your hands often to prevent spread of the virus.  Use your inhaler more if you have asthma.  Do not use any tobacco products, including cigarettes, chewing tobacco, or electronic cigarettes. If you need help quitting, ask your doctor. Contact a doctor if:  You are getting worse, not better.  Your symptoms are not helped by medicine.  You have chills.  You are getting more short of breath.  You have brown or red mucus.  You have yellow or brown discharge from your nose.  You have pain in your face, especially when you bend forward.  You have a fever.  You have puffy (swollen) neck glands.  You have pain while swallowing.  You have white areas in the back of your throat. Get help right away if:  You have very bad or constant: ? Headache. ? Ear pain. ? Pain in your forehead, behind your eyes, and over your cheekbones (sinus pain). ? Chest pain.  You have long-lasting (chronic) lung disease and any of the following: ? Wheezing. ? Long-lasting cough. ? Coughing up blood. ? A change in your usual mucus.  You have a stiff neck.  You have  changes in your: ? Vision. ? Hearing. ? Thinking. ? Mood. This information is not intended to replace advice given to you by your health care provider. Make sure you discuss any questions you have with your health care provider. Document Released: 07/27/2007 Document Revised: 10/11/2015 Document Reviewed: 05/15/2013 Elsevier Interactive Patient Education  2018 [employer] Inc[employer]  

## 2018-01-09 NOTE — Progress Notes (Signed)
Judy Foster is a 42 y.o. female who presents today with concerns of cough and wheezing for the last 4 days. She denies any known sick contacts, fever and primary reason for visit is patient feels that she hears herself wheezing.  Review of Systems  Constitutional: Negative for chills, fever and malaise/fatigue.  HENT: Negative for congestion, ear discharge, ear pain, sinus pain and sore throat.   Eyes: Negative.   Respiratory: Positive for cough and wheezing. Negative for sputum production and shortness of breath.   Cardiovascular: Negative.  Negative for chest pain.  Gastrointestinal: Negative for abdominal pain, diarrhea, nausea and vomiting.  Genitourinary: Negative for dysuria, frequency, hematuria and urgency.  Musculoskeletal: Negative for myalgias.  Skin: Negative.   Neurological: Negative for headaches.  Endo/Heme/Allergies: Negative.   Psychiatric/Behavioral: Negative.     O: Vitals:   01/09/18 1100  BP: 130/82  Pulse: 81  Temp: 98.9 F (37.2 C)  SpO2: 98%     Physical Exam  Constitutional: She is oriented to person, place, and time. Vital signs are normal. She appears well-developed and well-nourished. She is active.  Non-toxic appearance. She does not have a sickly appearance.  HENT:  Head: Normocephalic.  Right Ear: Hearing, tympanic membrane, external ear and ear canal normal.  Left Ear: Hearing, tympanic membrane, external ear and ear canal normal.  Nose: Nose normal.  Mouth/Throat: Uvula is midline and oropharynx is clear and moist.  Neck: Normal range of motion. Neck supple.  Cardiovascular: Normal rate, regular rhythm, normal heart sounds and normal pulses.  Pulmonary/Chest: Effort normal and breath sounds normal.  Abdominal: Soft. Bowel sounds are normal.  Musculoskeletal: Normal range of motion.  Lymphadenopathy:       Head (right side): No submental and no submandibular adenopathy present.       Head (left side): No submental and no submandibular  adenopathy present.    She has no cervical adenopathy.  Neurological: She is alert and oriented to person, place, and time.  Psychiatric: She has a normal mood and affect.  Vitals reviewed.  A: 1. Acute nasopharyngitis    P: Discussed exam findings, diagnosis etiology and medication use and indications reviewed with patient. Follow- Up and discharge instructions provided. No emergent/urgent issues found on exam.  Patient verbalized understanding of information provided and agrees with plan of care (POC), all questions answered.  1. Acute nasopharyngitis Suspect common cold- exam unremarkable- patient primary concern of wheezing not exhibited. Patient desired to continue over the counter cough and cold medication and follow up if symptoms did not improve.

## 2018-01-11 ENCOUNTER — Telehealth: Payer: Self-pay

## 2018-01-11 NOTE — Telephone Encounter (Signed)
Patient states she have  fever 102.0 F she is taking Tylenol and wanted to know if this fever can be something else and what to do.

## 2018-01-11 NOTE — Telephone Encounter (Signed)
After spoke with the provider about the patient symptoms, the provider told me to call the patient and let her know to alternate ibuprofen and tylenol and if she still have the fever tomorrow patient needs to come back and be evaluate by the provider, because the fever is new symptom.

## 2018-01-12 ENCOUNTER — Ambulatory Visit (INDEPENDENT_AMBULATORY_CARE_PROVIDER_SITE_OTHER): Payer: 59 | Admitting: Nurse Practitioner

## 2018-01-12 VITALS — BP 110/80 | HR 84 | Temp 99.5°F | Resp 16 | Wt 207.6 lb

## 2018-01-12 DIAGNOSIS — J069 Acute upper respiratory infection, unspecified: Secondary | ICD-10-CM

## 2018-01-12 DIAGNOSIS — R6889 Other general symptoms and signs: Secondary | ICD-10-CM

## 2018-01-12 DIAGNOSIS — R062 Wheezing: Secondary | ICD-10-CM

## 2018-01-12 LAB — POCT INFLUENZA A/B
INFLUENZA A, POC: NEGATIVE
Influenza B, POC: NEGATIVE

## 2018-01-12 MED ORDER — ALBUTEROL SULFATE HFA 108 (90 BASE) MCG/ACT IN AERS: 2.0000 | INHALATION_SPRAY | Freq: Four times a day (QID) | RESPIRATORY_TRACT | 0 refills | Status: DC | PRN

## 2018-01-12 NOTE — Patient Instructions (Signed)
Upper Respiratory Infection, Adult Take medication as prescribed. -Ibuprofen or Tylenol for pain, fever, or general discomfort. -Increase fluids. -Sleep elevated on at least 2 pillows at bedtime to help with cough. -Use a humidifier or vaporizer when at home and during sleep to help with cough. -May use a teaspoon of honey or over-the-counter cough drops to help with cough. -Continue to monitor symptoms at this time.  As discussed this most likely is a viral infection; however, will allow at least a 7 to 10-day timeframe to determine if this has improved.  If you have worsening of symptoms such as high fever greater than 102, worsening cough, purulent sputum production or other concerns, return to the office..  Most upper respiratory infections (URIs) are a viral infection of the air passages leading to the lungs. A URI affects the nose, throat, and upper air passages. The most common type of URI is nasopharyngitis and is typically referred to as "the common cold." URIs run their course and usually go away on their own. Most of the time, a URI does not require medical attention, but sometimes a bacterial infection in the upper airways can follow a viral infection. This is called a secondary infection. Sinus and middle ear infections are common types of secondary upper respiratory infections. Bacterial pneumonia can also complicate a URI. A URI can worsen asthma and chronic obstructive pulmonary disease (COPD). Sometimes, these complications can require emergency medical care and may be life threatening. What are the causes? Almost all URIs are caused by viruses. A virus is a type of germ and can spread from one person to another. What increases the risk? You may be at risk for a URI if:  You smoke.  You have chronic heart or lung disease.  You have a weakened defense (immune) system.  You are very young or very old.  You have nasal allergies or asthma.  You work in crowded or poorly  ventilated areas.  You work in health care facilities or schools.  What are the signs or symptoms? Symptoms typically develop 2-3 days after you come in contact with a cold virus. Most viral URIs last 7-10 days. However, viral URIs from the influenza virus (flu virus) can last 14-18 days and are typically more severe. Symptoms may include:  Runny or stuffy (congested) nose.  Sneezing.  Cough.  Sore throat.  Headache.  Fatigue.  Fever.  Loss of appetite.  Pain in your forehead, behind your eyes, and over your cheekbones (sinus pain).  Muscle aches.  How is this diagnosed? Your health care provider may diagnose a URI by:  Physical exam.  Tests to check that your symptoms are not due to another condition such as: ? Strep throat. ? Sinusitis. ? Pneumonia. ? Asthma.  How is this treated? A URI goes away on its own with time. It cannot be cured with medicines, but medicines may be prescribed or recommended to relieve symptoms. Medicines may help:  Reduce your fever.  Reduce your cough.  Relieve nasal congestion.  Follow these instructions at home:  Take medicines only as directed by your health care provider.  Gargle warm saltwater or take cough drops to comfort your throat as directed by your health care provider.  Use a warm mist humidifier or inhale steam from a shower to increase air moisture. This may make it easier to breathe.  Drink enough fluid to keep your urine clear or pale yellow.  Eat soups and other clear broths and maintain good nutrition.  Rest as needed.  Return to work when your temperature has returned to normal or as your health care provider advises. You may need to stay home longer to avoid infecting others. You can also use a face mask and careful hand washing to prevent spread of the virus.  Increase the usage of your inhaler if you have asthma.  Do not use any tobacco products, including cigarettes, chewing tobacco, or electronic  cigarettes. If you need help quitting, ask your health care provider. How is this prevented? The best way to protect yourself from getting a cold is to practice good hygiene.  Avoid oral or hand contact with people with cold symptoms.  Wash your hands often if contact occurs.  There is no clear evidence that vitamin C, vitamin E, echinacea, or exercise reduces the chance of developing a cold. However, it is always recommended to get plenty of rest, exercise, and practice good nutrition. Contact a health care provider if:  You are getting worse rather than better.  Your symptoms are not controlled by medicine.  You have chills.  You have worsening shortness of breath.  You have brown or red mucus.  You have yellow or brown nasal discharge.  You have pain in your face, especially when you bend forward.  You have a fever.  You have swollen neck glands.  You have pain while swallowing.  You have white areas in the back of your throat. Get help right away if:  You have severe or persistent: ? Headache. ? Ear pain. ? Sinus pain. ? Chest pain.  You have chronic lung disease and any of the following: ? Wheezing. ? Prolonged cough. ? Coughing up blood. ? A change in your usual mucus.  You have a stiff neck.  You have changes in your: ? Vision. ? Hearing. ? Thinking. ? Mood. This information is not intended to replace advice given to you by your health care provider. Make sure you discuss any questions you have with your health care provider. Document Released: 08/03/2000 Document Revised: 10/11/2015 Document Reviewed: 05/15/2013 Elsevier Interactive Patient Education  2018 [employer].  Bronchospasm, Adult Bronchospasm is a tightening of the airways going into the lungs. During an episode, it may be harder to breathe. You may cough, and you may make a whistling sound when you breathe (wheeze). This condition often affects people with asthma. What are the  causes? This condition is caused by swelling and irritation in the airways. It can be triggered by:  An infection (common).  Seasonal allergies.  An allergic reaction.  Exercise.  Irritants. These include pollution, cigarette smoke, strong odors, aerosol sprays, and paint fumes.  Weather changes. Winds increase molds and pollens in the air. Cold air may cause swelling.  Stress and emotional upset.  What are the signs or symptoms? Symptoms of this condition include:  Wheezing. If the episode was triggered by an allergy, wheezing may start right away or hours later.  Nighttime coughing.  Frequent or severe coughing with a simple cold.  Chest tightness.  Shortness of breath.  Decreased ability to exercise.  How is this diagnosed? This condition is usually diagnosed with a review of your medical history and a physical exam. Tests, such as lung function tests, are sometimes done to look for other conditions. The need for a chest X-ray depends on where the wheezing occurs and whether it is the first time you have wheezed. How is this treated? This condition may be treated with:  Inhaled medicines. These  open up the airways and help you breathe. They can be taken with an inhaler or a nebulizer device.  Corticosteroid medicines. These may be given for severe bronchospasm, usually when it is associated with asthma.  Avoiding triggers, such as irritants, infection, or allergies.  Follow these instructions at home: Medicines  Take over-the-counter and prescription medicines only as told by your health care provider.  If you need to use an inhaler or nebulizer to take your medicine, ask your health care provider to explain how to use it correctly. If you were given a spacer, always use it with your inhaler. Lifestyle  Reduce the number of triggers in your home. To do this: ? Change your heating and air conditioning filter at least once a month. ? Limit your use of fireplaces  and wood stoves. ? Do not smoke. Do not allow smoking in your home. ? Avoid using perfumes and fragrances. ? Get rid of pests, such as roaches and mice, and their droppings. ? Remove any mold from your home. ? Keep your house clean and dust free. Use unscented cleaning products. ? Replace carpet with wood, tile, or vinyl flooring. Carpet can trap dander and dust. ? Use allergy-proof pillows, mattress covers, and box spring covers. ? Wash bed sheets and blankets every week in hot water. Dry them in a dryer. ? Use blankets that are made of polyester or cotton. ? Wash your hands often. ? Do not allow pets in your bedroom.  Avoid breathing in cold air when you exercise. General instructions  Have a plan for seeking medical care. Know when to call your health care provider and local emergency services, and where to get emergency care.  Stay up to date on your immunizations.  When you have an episode of bronchospasm, stay calm. Try to relax and breathe more slowly.  If you have asthma, make sure you have an asthma action plan.  Keep all follow-up visits as told by your health care provider. This is important. Contact a health care provider if:  You have muscle aches.  You have chest pain.  The mucus that you cough up (sputum) changes from clear or white to yellow, green, gray, or bloody.  You have a fever.  Your sputum gets thicker. Get help right away if:  Your wheezing and coughing get worse, even after you take your prescribed medicines.  It gets even harder to breathe.  You develop severe chest pain. Summary  Bronchospasm is a tightening of the airways going into the lungs.  During an episode of bronchospasm, you may have a harder time breathing. You may cough and make a whistling sound when you breathe (wheeze).  Avoid exposure to triggers such as smoke, dust, mold, animal dander, and fragrances.  When you have an episode of bronchospasm, stay calm. Try to relax and  breathe more slowly. This information is not intended to replace advice given to you by your health care provider. Make sure you discuss any questions you have with your health care provider. Document Released: 02/10/2003 Document Revised: 02/04/2016 Document Reviewed: 02/04/2016 Elsevier Interactive Patient Education  2017 [employer].

## 2018-01-12 NOTE — Progress Notes (Signed)
Subjective:  Judy Foster is a 42 y.o. female who presents for evaluation of URI like symptoms.  Symptoms include new onset fever that started one day ago.  The patient was seen initially on 11/19 and diagnosed with a URI.  The patient returns today due to the fever.  The patient also continues to feel like she is still wheezing. The patient is concerned that she may have influenza or pneumonia.  Onset of symptoms was 5 days ago, and has been unchanged since that time.  Treatment to date:  ibuprofen.  High risk factors for influenza complications:  none.  The following portions of the patient's history were reviewed and updated as appropriate:  allergies, current medications and past medical history.  Constitutional: positive for fatigue and fevers, negative for anorexia, chills and sweats Eyes: negative Ears, nose, mouth, throat, and face: negative Respiratory: positive for cough and wheezing, negative for asthma, chronic bronchitis, dyspnea on exertion, sputum and stridor Cardiovascular: negative Gastrointestinal: negative Neurological: negative Objective:  BP 110/80 (BP Location: Right Arm, Patient Position: Sitting, Cuff Size: Normal)   Pulse 84   Temp 99.5 F (37.5 C) (Oral)   Resp 16   Wt 207 lb 9.6 oz (94.2 kg)   SpO2 96%   BMI 30.44 kg/m  General appearance: alert, cooperative, fatigued and no distress Head: Normocephalic, without obvious abnormality, atraumatic Eyes: conjunctivae/corneas clear. PERRL, EOM's intact. Fundi benign. Ears: normal TM's and external ear canals both ears Nose: Nares normal. Septum midline. Mucosa normal. No drainage or sinus tenderness. Throat: lips, mucosa, and tongue normal; teeth and gums normal Lungs: wheezes anterior - RUL Heart: regular rate and rhythm, S1, S2 normal, no murmur, click, rub or gallop Abdomen: soft, non-tender; bowel sounds normal; no masses,  no organomegaly Pulses: 2+ and symmetric Skin: Skin color, texture, turgor normal.  No rashes or lesions Lymph nodes: cervical and submandibular nodes normal Neurologic: Grossly normal    Assessment:  viral upper respiratory illness Wheezing   Plan:  Exam findings, diagnosis etiology and medication use and indications reviewed with patient. Follow- Up and discharge instructions provided. No emergent/urgent issues found on exam.  Discussed with patient that I still consider her symptoms to be of viral etiology.  Informed patient that the new onset of fever was not always indicative of a bacterial infection.  Patient's influenza result was negative.  Informed patient that due to the short duration of her symptoms, and some improvement within her existing symptoms, that this was still most likely a viral infection.  Patient denied any purulent nasal drainage or nasal drainage at all, worsening cough, or sinus sinus issues.  Patient was in agreement with continuing to watch and wait to see if her symptoms worsen.  Informed patient that typically if symptoms have not improved within "10" days then consideration could possibly be made for antibiotic but will need to wait and see.  Patient was in agreement with this plan of action.  Patient education was provided. Patient verbalized understanding of information provided and agrees with plan of care (POC), all questions answered. The patient is advised to call or return to clinic if condition does not see an improvement in symptoms, or to seek the care of the closest emergency department if condition worsens with the above plan.   1. Wheezing  - albuterol (PROVENTIL HFA;VENTOLIN HFA) 108 (90 Base) MCG/ACT inhaler; Inhale 2 puffs into the lungs every 6 (six) hours as needed for up to 10 days for wheezing.  Dispense: 1 Inhaler;  Refill: 0  2. Viral upper respiratory infection  Take medication as prescribed. -Ibuprofen or Tylenol for pain, fever, or general discomfort. -Increase fluids. -Sleep elevated on at least 2 pillows at bedtime to  help with cough. -Use a humidifier or vaporizer when at home and during sleep to help with cough. -May use a teaspoon of honey or over-the-counter cough drops to help with cough. -Continue to monitor symptoms at this time.  As discussed this most likely is a viral infection; however, will allow at least a 7 to 10-day timeframe to determine if this has improved.  If you have worsening of symptoms such as high fever greater than 102, worsening cough, purulent sputum production or other concerns, return to the office..   3. Flu-like symptoms  - POCT Influenza A/B-negative

## 2018-02-06 MED FILL — SERTRALINE HCL 100 MG TAB: 100 | 30 days supply | Qty: 30 | Fill #0

## 2018-03-13 MED FILL — SERTRALINE HCL 100 MG TAB: 100 | 30 days supply | Qty: 30 | Fill #1

## 2018-04-13 MED FILL — SERTRALINE HCL 100 MG TAB: 100 | 30 days supply | Qty: 30 | Fill #2

## 2018-05-16 MED FILL — SERTRALINE HCL 100 MG TAB: 100 | 30 days supply | Qty: 30 | Fill #3

## 2018-05-31 ENCOUNTER — Encounter: Payer: Self-pay | Admitting: Physician Assistant

## 2018-05-31 ENCOUNTER — Ambulatory Visit (INDEPENDENT_AMBULATORY_CARE_PROVIDER_SITE_OTHER): Payer: 59 | Admitting: Physician Assistant

## 2018-05-31 ENCOUNTER — Other Ambulatory Visit: Payer: Self-pay

## 2018-05-31 DIAGNOSIS — R635 Abnormal weight gain: Secondary | ICD-10-CM

## 2018-05-31 DIAGNOSIS — F429 Obsessive-compulsive disorder, unspecified: Secondary | ICD-10-CM

## 2018-05-31 DIAGNOSIS — R69 Illness, unspecified: Secondary | ICD-10-CM | POA: Diagnosis not present

## 2018-05-31 DIAGNOSIS — F331 Major depressive disorder, recurrent, moderate: Secondary | ICD-10-CM

## 2018-05-31 DIAGNOSIS — F411 Generalized anxiety disorder: Secondary | ICD-10-CM | POA: Diagnosis not present

## 2018-05-31 DIAGNOSIS — T50905A Adverse effect of unspecified drugs, medicaments and biological substances, initial encounter: Secondary | ICD-10-CM

## 2018-05-31 MED ORDER — BUPROPION HCL ER (XL) 150 MG PO TB24: 150.0000 mg | ORAL_TABLET | ORAL | 1 refills | Status: DC

## 2018-05-31 MED ORDER — N-ACETYL-L-CYSTEINE 600 MG PO CAPS: 600.0000 mg | ORAL_CAPSULE | Freq: Two times a day (BID) | ORAL | 0 refills | Status: DC

## 2018-05-31 MED ORDER — SERTRALINE HCL 100 MG PO TABS: 100.0000 mg | ORAL_TABLET | Freq: Every day | ORAL | 1 refills | Status: DC

## 2018-05-31 MED FILL — buPROPion HCL ER (XL) 150 M: 150 | 30 days supply | Qty: 30 | Fill #0

## 2018-05-31 MED FILL — SERTRALINE HCL 100 MG TAB: 100 | 30 days supply | Qty: 30 | Fill #0

## 2018-05-31 NOTE — Progress Notes (Signed)
[facility]  Patient ID: Judy Foster,  MRN: [id number]  PCP: Kathyrn Lass, MD  Date of Evaluation: 05/31/2018 Time spent:15 minutes  Chief Complaint:  Chief Complaint    Follow-up     Virtual Visit via Telephone Note  I connected with Judy Foster on 05/31/18 at  9:00 AM EDT by telephone and verified that I am speaking with the correct person using two identifiers.   I discussed the limitations, risks, security and privacy concerns of performing an evaluation and management service by telephone and the availability of in person appointments. I also discussed with the patient that there may be a patient responsible charge related to this service. The patient expressed understanding and agreed to proceed.    HISTORY/CURRENT STATUS: HPI For routine med check.  Doing well now. Has gained about 40# in past 2 years.  Eats all the time. Has tried Weight Watchers, other diets but has a hard time sticking with it.  Feels miserable and doesn't feel like exercising b/c she's so heavy.  Asked if there is anything that we can do.  Anxiety is controlled mostly.  She rarely uses the Xanax.  States that her OCD symptoms are more on the obsessive side, she does pick at her skin sometimes and when she was a child she pulled her eyelashes out all the time.  She does not really have compulsions, no rituals.  Toward the end of the winter, she was more blue feeling.  She just did not care about things but now that feeling is better. Patient denies loss of interest in usual activities and is able to enjoy things.  Denies decreased energy or motivation.  Appetite has not changed.  No extreme sadness, tearfulness, or feelings of hopelessness.  Denies any changes in concentration, making decisions or remembering things.  Denies suicidal or homicidal thoughts.  Denies muscle or joint pain, stiffness, or dystonia.  Denies dizziness, syncope, seizures, numbness, tingling, tremor, tics,  unsteady gait, slurred speech, confusion.   Individual Medical History/ Review of Systems: Changes? :Yes  weight gain.  Past medications for mental health diagnoses include: Prozac caused drowiness, Wellbutrin for about 2 weeks (stopped b/c pregnancy)   Allergies: Penicillins  Current Medications:  Current Outpatient Medications:  .  ALPRAZolam (XANAX) 0.5 MG tablet, Take by mouth at bedtime as needed for anxiety. Takes 1/2, Disp: , Rfl:  .  B Complex Vitamins (B COMPLEX PO), Take by mouth as needed. , Disp: , Rfl:  .  Omega-3 Fatty Acids (OMEGA-3 CF PO), Take by mouth., Disp: , Rfl:  .  Probiotic Product (PROBIOTIC DAILY PO), Take by mouth., Disp: , Rfl:  .  sertraline (ZOLOFT) 100 MG tablet, Take 1 tablet (100 mg total) by mouth daily., Disp: 30 tablet, Rfl: 1 .  Acetylcysteine (N-ACETYL-L-CYSTEINE) 600 MG CAPS, Take 600 mg by mouth 2 (two) times daily., Disp: 60 capsule, Rfl: 0 .  albuterol (PROVENTIL HFA;VENTOLIN HFA) 108 (90 Base) MCG/ACT inhaler, Inhale 2 puffs into the lungs every 6 (six) hours as needed for up to 10 days for wheezing., Disp: 1 Inhaler, Rfl: 0 .  buPROPion (WELLBUTRIN XL) 150 MG 24 hr tablet, Take 1 tablet (150 mg total) by mouth every morning., Disp: 30 tablet, Rfl: 1 .  cholecalciferol (VITAMIN D) 400 UNITS TABS tablet, Take 400 Units by mouth., Disp: , Rfl:  .  Flaxseed, Linseed, (FLAX SEED OIL PO), Take by mouth., Disp: , Rfl:  .  Multiple Vitamin (MULTIVITAMIN WITH MINERALS) TABS, Take  1 tablet by mouth every morning., Disp: , Rfl:  Medication Side Effects: none  Family Medical/ Social History: Changes? Yes working from home due to coronavirus pandemic.  She is a Marine scientist.  MENTAL HEALTH EXAM:  There were no vitals taken for this visit.There is no height or weight on file to calculate BMI.  General Appearance: phone visit, unable to assess  Eye Contact:  Unable to assess  Speech:  Clear and Coherent  Volume:  Normal  Mood:  Euthymic  Affect:  Unable to  assess  Thought Process:  Goal Directed  Orientation:  Full (Time, Place, and Person)  Thought Content: Logical   Suicidal Thoughts:  No  Homicidal Thoughts:  No  Memory:  WNL  Judgement:  Good  Insight:  Good  Psychomotor Activity:  Unable to assess  Concentration:  Concentration: Good  Recall:  Good  Fund of Knowledge: Good  Language: Good  Assets:  Desire for Improvement  ADL's:  Intact  Cognition: WNL  Prognosis:  Good    DIAGNOSES:    ICD-10-CM   1. Generalized anxiety disorder F41.1   2. Major depressive disorder, recurrent episode, moderate (HCC) F33.1   3. Obsessive-compulsive disorder, unspecified type F42.9   4. Weight gain due to medication R63.5    T50.905A     Receiving Psychotherapy: No    RECOMMENDATIONS: We discussed different options for her weight.  I recommend that we add Wellbutrin.  And it may be possible that we can wean off the Zoloft in the future and use only the Wellbutrin.  Benefits, risks, side effects were discussed and she accepts. Start Wellbutrin XL 150 mg p.o. every morning. Continue Zoloft 100 mg daily. Continue Xanax 0.5 mg daily as needed.  She takes rarely.  PDMP was reviewed. Diet and exercise were discussed. Return in 4 to 6 weeks.   Donnal Moat, PA-C   This record has been created using [employer].  Chart creation errors have been sought, but may not always have been located and corrected. Such creation errors do not reflect on the standard of medical care.

## 2018-06-05 ENCOUNTER — Telehealth: Payer: Self-pay | Admitting: Certified Nurse Midwife

## 2018-06-05 NOTE — Telephone Encounter (Signed)
Message   Hi Neoma Laming! Hope you are well during all of this.  I had a questions regarding my immunizations. 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 Do you know how I could access my immunizations?    Thank you,  Anderson Malta

## 2018-06-05 NOTE — Telephone Encounter (Signed)
Spoke with patient. Advised patient only immunization we have on file is her tetanus shot which was given 02/22/2008 per or records. Advised she is due to TDAP unless she has had another one else where. Recommended she contact her PCP for immunization records. Patient is agreeable.  Routing to provider and will close encounter.

## 2018-06-27 MED FILL — buPROPion HCL ER (XL) 150 M: 150 | 30 days supply | Qty: 30 | Fill #1

## 2018-06-28 ENCOUNTER — Encounter: Payer: Self-pay | Admitting: Physician Assistant

## 2018-06-28 ENCOUNTER — Other Ambulatory Visit: Payer: Self-pay

## 2018-06-28 ENCOUNTER — Ambulatory Visit: Payer: 59 | Admitting: Physician Assistant

## 2018-06-28 DIAGNOSIS — F331 Major depressive disorder, recurrent, moderate: Secondary | ICD-10-CM | POA: Diagnosis not present

## 2018-06-28 DIAGNOSIS — R635 Abnormal weight gain: Secondary | ICD-10-CM

## 2018-06-28 DIAGNOSIS — F411 Generalized anxiety disorder: Secondary | ICD-10-CM

## 2018-06-28 DIAGNOSIS — R69 Illness, unspecified: Secondary | ICD-10-CM | POA: Diagnosis not present

## 2018-06-28 DIAGNOSIS — T50905A Adverse effect of unspecified drugs, medicaments and biological substances, initial encounter: Secondary | ICD-10-CM

## 2018-06-28 MED ORDER — BUPROPION HCL ER (XL) 150 MG PO TB24: 150.0000 mg | ORAL_TABLET | ORAL | 0 refills | Status: DC

## 2018-06-28 MED ORDER — SERTRALINE HCL 100 MG PO TABS: 100.0000 mg | ORAL_TABLET | Freq: Every day | ORAL | 1 refills | Status: DC

## 2018-06-28 MED FILL — SERTRALINE HCL 100 MG TAB: 100 | 30 days supply | Qty: 30 | Fill #0

## 2018-06-28 NOTE — Progress Notes (Signed)
[facility]  Patient ID: Judy Foster,  MRN: [id number]  PCP: Kathyrn Lass, MD  Date of Evaluation: 06/28/2018 Time spent:15 minutes  Chief Complaint:  Chief Complaint    Depression; Medication Refill     Virtual Visit via Telephone Note  I connected with patient by a video enabled telemedicine application or telephone, with their informed consent, and verified patient privacy and that I am speaking with the correct person using two identifiers.  I am private, in my home and the patient is home.   I discussed the limitations, risks, security and privacy concerns of performing an evaluation and management service by telephone and the availability of in person appointments. I also discussed with the patient that there may be a patient responsible charge related to this service. The patient expressed understanding and agreed to proceed.   I discussed the assessment and treatment plan with the patient. The patient was provided an opportunity to ask questions and all were answered. The patient agreed with the plan and demonstrated an understanding of the instructions.   The patient was advised to call back or seek an in-person evaluation if the symptoms worsen or if the condition fails to improve as anticipated.  I provided 15 minutes of non-face-to-face time during this encounter.  HISTORY/CURRENT STATUS: HPI For f/u after starting Wellbutrin  We added the Wellbutrin 1 month ago to help decrease her appetite and hopefully give her more energy.  The energy and motivation is much better and she feels like exercising every day.  She has not really noticed that she is eating less but she feels better.  States that whether she is losing weight or not right now is okay because she is exercising more, almost on a daily basis.  And it is also hard to tell if her eating habits are less due to the coronavirus pandemic.  She is working from home, her kids are at home doing school  online, there is just a lot going on.  She denies anhedonia, not crying easily.  She has no suicidal or homicidal thoughts.  Anxiety is not an issue for the most part.  She has not had a panic attack in a long time.  She takes the Xanax occasionally but it is for sleep, not anxiety.  Denies dizziness, syncope, seizures, numbness, tingling, tremor, tics, unsteady gait, slurred speech, confusion.  Denies muscle or joint pain, stiffness, or dystonia.  Individual Medical History/ Review of Systems: Changes? :No    Past medications for mental health diagnoses include: Prozac caused drowiness, Wellbutrin for about 2 weeks (stopped b/c pregnancy)  Allergies: Penicillins  Current Medications:  Current Outpatient Medications:  .  ALPRAZolam (XANAX) 0.5 MG tablet, Take by mouth at bedtime as needed for anxiety. Takes 1/2, Disp: , Rfl:  .  buPROPion (WELLBUTRIN XL) 150 MG 24 hr tablet, Take 1 tablet (150 mg total) by mouth every morning., Disp: 90 tablet, Rfl: 0 .  Flaxseed, Linseed, (FLAX SEED OIL PO), Take by mouth., Disp: , Rfl:  .  Multiple Vitamin (MULTIVITAMIN WITH MINERALS) TABS, Take 1 tablet by mouth every morning., Disp: , Rfl:  .  Omega-3 Fatty Acids (OMEGA-3 CF PO), Take by mouth., Disp: , Rfl:  .  Probiotic Product (PROBIOTIC DAILY PO), Take by mouth., Disp: , Rfl:  .  sertraline (ZOLOFT) 100 MG tablet, Take 1 tablet (100 mg total) by mouth daily., Disp: 30 tablet, Rfl: 1 .  Acetylcysteine (N-ACETYL-L-CYSTEINE) 600 MG CAPS, Take 600 mg by mouth  2 (two) times daily. (Patient not taking: Reported on 06/28/2018), Disp: 60 capsule, Rfl: 0 .  albuterol (PROVENTIL HFA;VENTOLIN HFA) 108 (90 Base) MCG/ACT inhaler, Inhale 2 puffs into the lungs every 6 (six) hours as needed for up to 10 days for wheezing., Disp: 1 Inhaler, Rfl: 0 .  B Complex Vitamins (B COMPLEX PO), Take by mouth as needed. , Disp: , Rfl:  .  cholecalciferol (VITAMIN D) 400 UNITS TABS tablet, Take 400 Units by mouth., Disp: , Rfl:   Medication Side Effects: none  Family Medical/ Social History: Changes? Yes working from home due to coronavirus pandemic and shelter in place orders.  MENTAL HEALTH EXAM:  There were no vitals taken for this visit.There is no height or weight on file to calculate BMI.  General Appearance: Unable to assess  Eye Contact:  Unable to assess  Speech:  Clear and Coherent  Volume:  Normal  Mood:  Euthymic  Affect:  Unable to assess  Thought Process:  Goal Directed  Orientation:  Full (Time, Place, and Person)  Thought Content: Logical   Suicidal Thoughts:  No  Homicidal Thoughts:  No  Memory:  WNL  Judgement:  Good  Insight:  Good  Psychomotor Activity:  Unable to assess  Concentration:  Concentration: Good  Recall:  Good  Fund of Knowledge: Good  Language: Good  Assets:  Desire for Improvement  ADL's:  Intact  Cognition: WNL  Prognosis:  Fair    DIAGNOSES:    ICD-10-CM   1. Major depressive disorder, recurrent episode, moderate (HCC) F33.1   2. Weight gain due to medication R63.5    T50.905A   3. Generalized anxiety disorder F41.1     Receiving Psychotherapy: No    RECOMMENDATIONS: I am glad to see her doing so well! Continue Wellbutrin XL 150 mg p.o. daily. Continue Zoloft 100 mg daily. Continue Xanax 0.5 mg 1 daily as needed. She prefers not to stay on the Zoloft and Wellbutrin at the same time unless absolutely necessary.  I agree.  I recommend staying on both for right now, due to all the changes because of the coronavirus pandemic.  I do not want a rock the boat by weaning off of Zoloft.  It may be helping more with anxiety and depression then we know, until we take her off of it.  She understands and is in agreement.  Will see each other in 4 to 6 weeks and can discuss weaning off at that point.  Hopefully things will go back to normal between now and then. Return in 4 to 6 weeks.   Donnal Moat, PA-C   This record has been created using [employer].   Chart creation errors have been sought, but may not always have been located and corrected. Such creation errors do not reflect on the standard of medical care.

## 2018-07-06 ENCOUNTER — Ambulatory Visit: Payer: 59 | Admitting: Certified Nurse Midwife

## 2018-07-18 DIAGNOSIS — Z6832 Body mass index (BMI) 32.0-32.9, adult: Secondary | ICD-10-CM | POA: Diagnosis not present

## 2018-07-18 DIAGNOSIS — Z111 Encounter for screening for respiratory tuberculosis: Secondary | ICD-10-CM | POA: Diagnosis not present

## 2018-07-18 DIAGNOSIS — E663 Overweight: Secondary | ICD-10-CM | POA: Diagnosis not present

## 2018-07-18 DIAGNOSIS — E785 Hyperlipidemia, unspecified: Secondary | ICD-10-CM | POA: Diagnosis not present

## 2018-07-18 DIAGNOSIS — Z23 Encounter for immunization: Secondary | ICD-10-CM | POA: Diagnosis not present

## 2018-07-27 ENCOUNTER — Other Ambulatory Visit: Payer: Self-pay

## 2018-07-27 ENCOUNTER — Encounter: Payer: Self-pay | Admitting: Physician Assistant

## 2018-07-27 ENCOUNTER — Ambulatory Visit (INDEPENDENT_AMBULATORY_CARE_PROVIDER_SITE_OTHER): Payer: 59 | Admitting: Physician Assistant

## 2018-07-27 DIAGNOSIS — F429 Obsessive-compulsive disorder, unspecified: Secondary | ICD-10-CM

## 2018-07-27 DIAGNOSIS — R635 Abnormal weight gain: Secondary | ICD-10-CM

## 2018-07-27 DIAGNOSIS — F331 Major depressive disorder, recurrent, moderate: Secondary | ICD-10-CM | POA: Diagnosis not present

## 2018-07-27 DIAGNOSIS — T50905A Adverse effect of unspecified drugs, medicaments and biological substances, initial encounter: Secondary | ICD-10-CM

## 2018-07-27 DIAGNOSIS — F411 Generalized anxiety disorder: Secondary | ICD-10-CM | POA: Diagnosis not present

## 2018-07-27 DIAGNOSIS — R69 Illness, unspecified: Secondary | ICD-10-CM | POA: Diagnosis not present

## 2018-07-27 MED ORDER — BUPROPION HCL ER (XL) 150 MG PO TB24: 150.0000 mg | ORAL_TABLET | ORAL | 0 refills | Status: DC

## 2018-07-27 MED ORDER — SERTRALINE HCL 100 MG PO TABS: 100.0000 mg | ORAL_TABLET | Freq: Every day | ORAL | 0 refills | Status: DC

## 2018-07-27 MED FILL — buPROPion HCL ER (XL) 150 M: 150 | 30 days supply | Qty: 30 | Fill #0

## 2018-07-27 MED FILL — SERTRALINE HCL 100 MG TAB: 100 | 30 days supply | Qty: 30 | Fill #0

## 2018-07-27 NOTE — Progress Notes (Signed)
[facility]  Patient ID: Judy Foster,  MRN: [id number]  PCP: Kathyrn Lass, MD  Date of Evaluation: 07/27/2018 Time spent:15 minutes  Chief Complaint:  Chief Complaint    Depression; Follow-up     Virtual Visit via Telephone Note  I connected with patient by a video enabled telemedicine application or telephone, with their informed consent, and verified patient privacy and that I am speaking with the correct person using two identifiers.  I am private, in my office at [facility] and the patient is at home.  I discussed the limitations, risks, security and privacy concerns of performing an evaluation and management service by telephone and the availability of in person appointments. I also discussed with the patient that there may be a patient responsible charge related to this service. The patient expressed understanding and agreed to proceed.   I discussed the assessment and treatment plan with the patient. The patient was provided an opportunity to ask questions and all were answered. The patient agreed with the plan and demonstrated an understanding of the instructions.   The patient was advised to call back or seek an in-person evaluation if the symptoms worsen or if the condition fails to improve as anticipated.  I provided 15 minutes of non-face-to-face time during this encounter.  HISTORY/CURRENT STATUS: HPI For 1 month med check.  Afraid of being on the Zoloft and Wellbutrin.  She has read that being on 2 antidepressants can increase risk of seizures.  She read something about eating disorders, which she has never had, with antidepressants and she was thinking Zoloft specifically, could lead to seizures.  She has never had a seizure.  We added Wellbutrin to the Zoloft in April of this year because of weight gain secondary to Zoloft.  Her weight has leveled off.  Because this is a phone visit I am unable to follow that.  As far as her mood goes, she is doing  well.  Course with everything that is going on in the world, like the coronavirus pandemic and protests and rioting due to racial tensions, she has been a bit more anxious.  She is able to enjoy things.  Energy and motivation are good.  She does not cry easily.  She is not isolating any more than she has to.  She works from home and that is still going well.  She occasionally will use melatonin to help her sleep.  Has trouble falling asleep sometimes in the melatonin helps her get tired.  She has started doing yoga every day as well as walking which helps get some extra energy out and makes her tired.  That helps with the anxiety as well as sleep.  Denies dizziness, syncope, seizures, numbness, tingling, tremor, tics, unsteady gait, slurred speech, confusion. Denies muscle or joint pain, stiffness, or dystonia.  Individual Medical History/ Review of Systems: Changes? :No    Past medications for mental health diagnoses include: Prozac caused drowiness, Wellbutrin for about 2 weeks (stopped b/c pregnancy) Trazodone, Melatonin  Allergies: Penicillins  Current Medications:  Current Outpatient Medications:  .  Acetylcysteine (N-ACETYL-L-CYSTEINE) 600 MG CAPS, Take 600 mg by mouth 2 (two) times daily. (Patient not taking: Reported on 06/28/2018), Disp: 60 capsule, Rfl: 0 .  albuterol (PROVENTIL HFA;VENTOLIN HFA) 108 (90 Base) MCG/ACT inhaler, Inhale 2 puffs into the lungs every 6 (six) hours as needed for up to 10 days for wheezing., Disp: 1 Inhaler, Rfl: 0 .  ALPRAZolam (XANAX) 0.5 MG tablet, Take by mouth at bedtime  as needed for anxiety. Takes 1/2, Disp: , Rfl:  .  B Complex Vitamins (B COMPLEX PO), Take by mouth as needed. , Disp: , Rfl:  .  buPROPion (WELLBUTRIN XL) 150 MG 24 hr tablet, Take 1 tablet (150 mg total) by mouth every morning., Disp: 90 tablet, Rfl: 0 .  cholecalciferol (VITAMIN D) 400 UNITS TABS tablet, Take 400 Units by mouth., Disp: , Rfl:  .  Flaxseed, Linseed, (FLAX SEED OIL PO),  Take by mouth., Disp: , Rfl:  .  Multiple Vitamin (MULTIVITAMIN WITH MINERALS) TABS, Take 1 tablet by mouth every morning., Disp: , Rfl:  .  Omega-3 Fatty Acids (OMEGA-3 CF PO), Take by mouth., Disp: , Rfl:  .  Probiotic Product (PROBIOTIC DAILY PO), Take by mouth., Disp: , Rfl:  .  sertraline (ZOLOFT) 100 MG tablet, Take 1 tablet (100 mg total) by mouth daily., Disp: 90 tablet, Rfl: 0 Medication Side Effects: none  Family Medical/ Social History: Changes? No  MENTAL HEALTH EXAM:  There were no vitals taken for this visit.There is no height or weight on file to calculate BMI.  General Appearance: Unable to assess  Eye Contact:  Unable to assess  Speech:  Clear and Coherent  Volume:  Normal  Mood:  Euthymic  Affect:  Unable to assess  Thought Process:  Goal Directed  Orientation:  Full (Time, Place, and Person)  Thought Content: Logical   Suicidal Thoughts:  No  Homicidal Thoughts:  No  Memory:  WNL  Judgement:  Good  Insight:  Good  Psychomotor Activity:  Unable to assess  Concentration:  Concentration: Good  Recall:  Good  Fund of Knowledge: Good  Language: Good  Assets:  Desire for Improvement  ADL's:  Intact  Cognition: WNL  Prognosis:  Good    DIAGNOSES:    ICD-10-CM   1. Major depressive disorder, recurrent episode, moderate (HCC) F33.1   2. Generalized anxiety disorder F41.1   3. Weight gain due to medication R63.5    T50.905A   4. Obsessive-compulsive disorder, unspecified type F42.9     Receiving Psychotherapy: No    RECOMMENDATIONS:  Discussed the increased seizure risk with Wellbutrin, not the Zoloft.  And it is not due to the fact that she is on 2 different antidepressants, it is the Wellbutrin itself.  She is doing so well I recommend that she stay on both drugs, especially right now with all of the chaos going on in the world.  I really do not want to make any changes.  I recommended that she discuss this with her pharmacist.  And her PCP if she would  like.  I have also referred her to Web MD as well as Up-To-Date (he is a Marine scientist and may be able to get information on "seizures with antidepressants" through the hospital [facility].)  She verbalizes understanding and agrees to stay on both at this time. Continue Zoloft 100 mg daily. Continue Wellbutrin XL 150 mg daily. Continue Xanax 0.5 mg daily as needed. Continue melatonin as needed. Return in 2 months.  Donnal Moat, PA-C   This record has been created using [employer].  Chart creation errors have been sought, but may not always have been located and corrected. Such creation errors do not reflect on the standard of medical care.

## 2018-08-30 MED FILL — buPROPion HCL ER (XL) 150 M: 150 | 30 days supply | Qty: 30 | Fill #1

## 2018-09-07 DIAGNOSIS — D225 Melanocytic nevi of trunk: Secondary | ICD-10-CM | POA: Diagnosis not present

## 2018-09-07 DIAGNOSIS — D2262 Melanocytic nevi of left upper limb, including shoulder: Secondary | ICD-10-CM | POA: Diagnosis not present

## 2018-09-07 DIAGNOSIS — D2271 Melanocytic nevi of right lower limb, including hip: Secondary | ICD-10-CM | POA: Diagnosis not present

## 2018-09-07 DIAGNOSIS — L821 Other seborrheic keratosis: Secondary | ICD-10-CM | POA: Diagnosis not present

## 2018-09-07 DIAGNOSIS — L700 Acne vulgaris: Secondary | ICD-10-CM | POA: Diagnosis not present

## 2018-09-07 MED FILL — TRETINOIN 0.025% CREAM: 0.025 | 29 days supply | Qty: 45 | Fill #0

## 2018-09-07 MED FILL — SPIRONOLACTONE 100 MG TAB: 100 | 30 days supply | Qty: 30 | Fill #0

## 2018-09-19 MED FILL — SERTRALINE HCL 100 MG TAB: 100 | 30 days supply | Qty: 30 | Fill #1

## 2018-10-05 MED FILL — buPROPion HCL ER (XL) 150 M: 150 | 30 days supply | Qty: 30 | Fill #2

## 2018-10-05 MED FILL — SPIRONOLACTONE 100 MG TAB: 100 | 30 days supply | Qty: 30 | Fill #1

## 2018-10-13 MED FILL — SERTRALINE HCL 100 MG TAB: 100 | 30 days supply | Qty: 30 | Fill #2

## 2018-10-18 ENCOUNTER — Other Ambulatory Visit: Payer: Self-pay

## 2018-10-22 ENCOUNTER — Encounter: Payer: Self-pay | Admitting: Certified Nurse Midwife

## 2018-10-22 ENCOUNTER — Ambulatory Visit: Payer: 59 | Admitting: Certified Nurse Midwife

## 2018-10-22 ENCOUNTER — Other Ambulatory Visit: Payer: Self-pay

## 2018-10-22 VITALS — BP 108/62 | HR 68 | Temp 97.2°F | Resp 16 | Ht 69.25 in | Wt 193.0 lb

## 2018-10-22 DIAGNOSIS — K648 Other hemorrhoids: Secondary | ICD-10-CM

## 2018-10-22 DIAGNOSIS — Z8659 Personal history of other mental and behavioral disorders: Secondary | ICD-10-CM | POA: Diagnosis not present

## 2018-10-22 DIAGNOSIS — R69 Illness, unspecified: Secondary | ICD-10-CM | POA: Diagnosis not present

## 2018-10-22 DIAGNOSIS — Z01411 Encounter for gynecological examination (general) (routine) with abnormal findings: Secondary | ICD-10-CM | POA: Diagnosis not present

## 2018-10-22 MED FILL — SERTRALINE HCL 100 MG TAB: 100 | 30 days supply | Qty: 30 | Fill #2

## 2018-10-22 NOTE — Progress Notes (Signed)
43 y.o. [id number] Married  Caucasian Fe here for annual exam. Periods normal, less cramping and less amount duration 4-5 days. Contraception spouse vasectomy. Has lost 2 pounds. Working at home totally for the most part. Sees PCP yearly with labs. Working on exercise and diet for cholesterol elevation last check 299. Medications stable and no change per Psychiatry for depression and anxiety. Planning to work on coming off Zoloft. No other health issues today.   LMP 10/14/2018       Sexually active: Yes.    The current method of family planning is vasectomy.    Exercising: Yes.    yoga Smoker:  no  Review of Systems  Constitutional: Negative.   HENT: Negative.   Eyes: Negative.   Respiratory: Negative.   Cardiovascular: Negative.   Gastrointestinal: Negative.   Genitourinary: Negative.   Musculoskeletal: Negative.   Skin: Negative.   Neurological: Negative.   Endo/Heme/Allergies: Negative.   Psychiatric/Behavioral: Negative.     Health Maintenance: Pap:  05-07-15 neg, 06-30-17 neg HPV HR neg History of Abnormal Pap: no MMG:  11-21-2017 category b density birads 1:neg Self Breast exams: yes Colonoscopy:  2015 neg BMD:   none TDaP:  UTD Shingles: no Pneumonia: no Hep C and HIV: HIV neg 2000 per patient Labs: if needed.   reports that she has never smoked. She has never used smokeless tobacco. She reports that she does not drink alcohol or use drugs.  Past Medical History:  Diagnosis Date  . Anal fissure   . Anxiety   . Asthma, exercise induced    as a teen  . Cardiac arrhythmia   . Depression   . Dysmenorrhea    past  . HLD (hyperlipidemia)   . Mononucleosis 1993  . Shingles     Past Surgical History:  Procedure Laterality Date  . CRANIECTOMY SUBOCCIPITAL W/ CERVICAL LAMINECTOMY / CHIARI      Current Outpatient Medications  Medication Sig Dispense Refill  . Acetylcysteine (N-ACETYL-L-CYSTEINE) 600 MG CAPS Take 600 mg by mouth 2 (two) times daily. (Patient not  taking: Reported on 06/28/2018) 60 capsule 0  . albuterol (PROVENTIL HFA;VENTOLIN HFA) 108 (90 Base) MCG/ACT inhaler Inhale 2 puffs into the lungs every 6 (six) hours as needed for up to 10 days for wheezing. 1 Inhaler 0  . ALPRAZolam (XANAX) 0.5 MG tablet Take by mouth at bedtime as needed for anxiety. Takes 1/2    . B Complex Vitamins (B COMPLEX PO) Take by mouth as needed.     Marland Kitchen buPROPion (WELLBUTRIN XL) 150 MG 24 hr tablet Take 1 tablet (150 mg total) by mouth every morning. 90 tablet 0  . cholecalciferol (VITAMIN D) 400 UNITS TABS tablet Take 400 Units by mouth.    . Flaxseed, Linseed, (FLAX SEED OIL PO) Take by mouth.    . Multiple Vitamin (MULTIVITAMIN WITH MINERALS) TABS Take 1 tablet by mouth every morning.    . Omega-3 Fatty Acids (OMEGA-3 CF PO) Take by mouth.    . Probiotic Product (PROBIOTIC DAILY PO) Take by mouth.    . sertraline (ZOLOFT) 100 MG tablet Take 1 tablet (100 mg total) by mouth daily. 90 tablet 0   No current facility-administered medications for this visit.     Family History  Problem Relation Age of Onset  . Colon cancer Maternal Grandmother   . Thyroid disease Maternal Grandmother   . Stroke Maternal Grandmother   . Kidney disease Maternal Grandmother   . Leukemia Maternal Grandfather   . Heart disease  Maternal Grandfather   . Hypertension Mother   . Thyroid disease Mother        hypothyroid  . Colon polyps Mother   . Diabetes Mother   . Breast cancer Mother 19    ROS:  Pertinent items are noted in HPI.  Otherwise, a comprehensive ROS was negative.  Exam:   There were no vitals taken for this visit.   Ht Readings from Last 3 Encounters:  06/30/17 5' 9.25" (1.759 m)  05/11/16 5' 8.75" (1.746 m)  02/26/16 5\' 9"  (1.753 m)    General appearance: alert, cooperative and appears stated age Head: Normocephalic, without obvious abnormality, atraumatic Neck: no adenopathy, supple, symmetrical, trachea midline and thyroid normal to inspection and  palpation Lungs: clear to auscultation bilaterally Breasts: normal appearance, no masses or tenderness, No nipple retraction or dimpling, No nipple discharge or bleeding, No axillary or supraclavicular adenopathy Heart: regular rate and rhythm Abdomen: soft, non-tender; no masses,  no organomegaly Extremities: extremities normal, atraumatic, no cyanosis or edema Skin: Skin color, texture, turgor normal. No rashes or lesions Lymph nodes: Cervical, supraclavicular, and axillary nodes normal. No abnormal inguinal nodes palpated Neurologic: Grossly normal   Pelvic: External genitalia:  no lesions              Urethra:  normal appearing urethra with no masses, tenderness or lesions              Bartholin's and Skene's: normal                 Vagina: normal appearing vagina with normal color and discharge, no lesions              Cervix: multiparous appearance, no cervical motion tenderness and no lesions              Pap taken: No. Bimanual Exam:  Uterus:  normal size, contour, position, consistency, mobility, non-tender and midposition              Adnexa: normal adnexa and no mass, fullness, tenderness               Rectovaginal: Confirms               Anus:  normal sphincter tone, external hemorrhoid noted, anal scope used and small internal hemorrhoid noted, not thrombosed.  Chaperone present: yes  A:  Well Woman with normal   Contraception spouse vasectomy  Anxiety/depression with Psychiatry management, all stable per patient.  PCP for labs and aex  Internal hemorrhoid noted, history of fissure.  P:   Reviewed health and wellness pertinent to exam  Discussed importance of office visits with MD to help with weaning off medication.  Discussed internal hemorrhoid noted and importance of good fiber in diet and stool softener to avoid bleeding. She is aware and will advise if issues.  Pap smear: no  counseled on breast self exam, mammography screening, adequate intake of calcium and  vitamin D, diet and exercise  return annually or prn  An After Visit Summary was printed and given to the patient.

## 2018-10-22 NOTE — Patient Instructions (Signed)
EXERCISE AND DIET:  We recommended that you start or continue a regular exercise program for good health. Regular exercise means any activity that makes your heart beat faster and makes you sweat.  We recommend exercising at least 30 minutes per day at least 3 days a week, preferably 4 or 5.  We also recommend a diet low in fat and sugar.  Inactivity, poor dietary choices and obesity can cause diabetes, heart attack, stroke, and kidney damage, among others.   ° °ALCOHOL AND SMOKING:  Women should limit their alcohol intake to no more than 7 drinks/beers/glasses of wine (combined, not each!) per week. Moderation of alcohol intake to this level decreases your risk of breast cancer and liver damage. And of course, no recreational drugs are part of a healthy lifestyle.  And absolutely no smoking or even second hand smoke. Most people know smoking can cause heart and lung diseases, but did you know it also contributes to weakening of your bones? Aging of your skin?  Yellowing of your teeth and nails? ° °CALCIUM AND VITAMIN D:  Adequate intake of calcium and Vitamin D are recommended.  The recommendations for exact amounts of these supplements seem to change often, but generally speaking 600 mg of calcium (either carbonate or citrate) and 800 units of Vitamin D per day seems prudent. Certain women may benefit from higher intake of Vitamin D.  If you are among these women, your doctor will have told you during your visit.   ° °PAP SMEARS:  Pap smears, to check for cervical cancer or precancers,  have traditionally been done yearly, although recent scientific advances have shown that most women can have pap smears less often.  However, every woman still should have a physical exam from her gynecologist every year. It will include a breast check, inspection of the vulva and vagina to check for abnormal growths or skin changes, a visual exam of the cervix, and then an exam to evaluate the size and shape of the uterus and  ovaries.  And after 40 years of age, a rectal exam is indicated to check for rectal cancers. We will also provide age appropriate advice regarding health maintenance, like when you should have certain vaccines, screening for sexually transmitted diseases, bone density testing, colonoscopy, mammograms, etc.  ° °MAMMOGRAMS:  All women over 40 years old should have a yearly mammogram. Many facilities now offer a "3D" mammogram, which may cost around $50 extra out of pocket. If possible,  we recommend you accept the option to have the 3D mammogram performed.  It both reduces the number of women who will be called back for extra views which then turn out to be normal, and it is better than the routine mammogram at detecting truly abnormal areas.   ° °COLONOSCOPY:  Colonoscopy to screen for colon cancer is recommended for all women at age 50.  We know, you hate the idea of the prep.  We agree, BUT, having colon cancer and not knowing it is worse!!  Colon cancer so often starts as a polyp that can be seen and removed at colonscopy, which can quite literally save your life!  And if your first colonoscopy is normal and you have no family history of colon cancer, most women don't have to have it again for 10 years.  Once every ten years, you can do something that may end up saving your life, right?  We will be happy to help you get it scheduled when you are ready.    Be sure to check your insurance coverage so you understand how much it will cost.  It may be covered as a preventative service at no cost, but you should check your particular policy.      Hemorrhoids Hemorrhoids are swollen veins in and around the rectum or anus. There are two types of hemorrhoids:  Internal hemorrhoids. These occur in the veins that are just inside the rectum. They may poke through to the outside and become irritated and painful.  External hemorrhoids. These occur in the veins that are outside the anus and can be felt as a painful  swelling or hard lump near the anus. Most hemorrhoids do not cause serious problems, and they can be managed with home treatments such as diet and lifestyle changes. If home treatments do not help the symptoms, procedures can be done to shrink or remove the hemorrhoids. What are the causes? This condition is caused by increased pressure in the anal area. This pressure may result from various things, including:  Constipation.  Straining to have a bowel movement.  Diarrhea.  Pregnancy.  Obesity.  Sitting for long periods of time.  Heavy lifting or other activity that causes you to strain.  Anal sex.  Riding a bike for a long period of time. What are the signs or symptoms? Symptoms of this condition include:  Pain.  Anal itching or irritation.  Rectal bleeding.  Leakage of stool (feces).  Anal swelling.  One or more lumps around the anus. How is this diagnosed? This condition can often be diagnosed through a visual exam. Other exams or tests may also be done, such as:  An exam that involves feeling the rectal area with a gloved hand (digital rectal exam).  An exam of the anal canal that is done using a small tube (anoscope).  A blood test, if you have lost a significant amount of blood.  A test to look inside the colon using a flexible tube with a camera on the end (sigmoidoscopy or colonoscopy). How is this treated? This condition can usually be treated at home. However, various procedures may be done if dietary changes, lifestyle changes, and other home treatments do not help your symptoms. These procedures can help make the hemorrhoids smaller or remove them completely. Some of these procedures involve surgery, and others do not. Common procedures include:  Rubber band ligation. Rubber bands are placed at the base of the hemorrhoids to cut off their blood supply.  Sclerotherapy. Medicine is injected into the hemorrhoids to shrink them.  Infrared coagulation. A  type of light energy is used to get rid of the hemorrhoids.  Hemorrhoidectomy surgery. The hemorrhoids are surgically removed, and the veins that supply them are tied off.  Stapled hemorrhoidopexy surgery. The surgeon staples the base of the hemorrhoid to the rectal wall. Follow these instructions at home: Eating and drinking   Eat foods that have a lot of fiber in them, such as whole grains, beans, nuts, fruits, and vegetables.  Ask your health care provider about taking products that have added fiber (fiber supplements).  Reduce the amount of fat in your diet. You can do this by eating low-fat dairy products, eating less red meat, and avoiding processed foods.  Drink enough fluid to keep your urine pale yellow. Managing pain and swelling   Take warm sitz baths for 20 minutes, 3-4 times a day to ease pain and discomfort. You may do this in a bathtub or using a portable sitz bath that fits  over the toilet.  If directed, apply ice to the affected area. Using ice packs between sitz baths may be helpful. ? Put ice in a plastic bag. ? Place a towel between your skin and the bag. ? Leave the ice on for 20 minutes, 2-3 times a day. General instructions  Take over-the-counter and prescription medicines only as told by your health care provider.  Use medicated creams or suppositories as told.  Get regular exercise. Ask your health care provider how much and what kind of exercise is best for you. In general, you should do moderate exercise for at least 30 minutes on most days of the week (150 minutes each week). This can include activities such as walking, biking, or yoga.  Go to the bathroom when you have the urge to have a bowel movement. Do not wait.  Avoid straining to have bowel movements.  Keep the anal area dry and clean. Use wet toilet paper or moist towelettes after a bowel movement.  Do not sit on the toilet for long periods of time. This increases blood pooling and  pain.  Keep all follow-up visits as told by your health care provider. This is important. Contact a health care provider if you have:  Increasing pain and swelling that are not controlled by treatment or medicine.  Difficulty having a bowel movement, or you are unable to have a bowel movement.  Pain or inflammation outside the area of the hemorrhoids. Get help right away if you have:  Uncontrolled bleeding from your rectum. Summary  Hemorrhoids are swollen veins in and around the rectum or anus.  Most hemorrhoids can be managed with home treatments such as diet and lifestyle changes.  Taking warm sitz baths can help ease pain and discomfort.  In severe cases, procedures or surgery can be done to shrink or remove the hemorrhoids. This information is not intended to replace advice given to you by your health care provider. Make sure you discuss any questions you have with your health care provider. Document Released: 02/05/2000 Document Revised: 02/15/2018 Document Reviewed: 06/29/2017 Elsevier Patient Education  2020 [employer].

## 2018-11-07 MED FILL — SPIRONOLACTONE 100 MG TAB: 100 | 30 days supply | Qty: 30 | Fill #2

## 2018-11-07 MED FILL — buPROPion HCL ER (XL) 150 M: 150 | 30 days supply | Qty: 30 | Fill #0

## 2018-11-08 ENCOUNTER — Other Ambulatory Visit: Payer: Self-pay

## 2018-11-08 ENCOUNTER — Other Ambulatory Visit: Payer: Self-pay | Admitting: Certified Nurse Midwife

## 2018-11-08 DIAGNOSIS — Z1231 Encounter for screening mammogram for malignant neoplasm of breast: Secondary | ICD-10-CM

## 2018-11-08 MED ORDER — BUPROPION HCL ER (XL) 150 MG PO TB24: 150.0000 mg | ORAL_TABLET | ORAL | 0 refills | Status: DC

## 2018-11-22 ENCOUNTER — Other Ambulatory Visit: Payer: Self-pay | Admitting: Physician Assistant

## 2018-11-23 MED FILL — SERTRALINE HCL 100 MG TAB: 100 | 30 days supply | Qty: 30 | Fill #0

## 2018-11-30 DIAGNOSIS — Z23 Encounter for immunization: Secondary | ICD-10-CM | POA: Diagnosis not present

## 2018-12-06 MED FILL — buPROPion HCL ER (XL) 150 M: 150 | 30 days supply | Qty: 30 | Fill #1

## 2018-12-06 MED FILL — SPIRONOLACTONE 100 MG TAB: 100 | 30 days supply | Qty: 30 | Fill #3

## 2018-12-07 ENCOUNTER — Ambulatory Visit: Payer: 59 | Admitting: Physician Assistant

## 2018-12-20 ENCOUNTER — Other Ambulatory Visit: Payer: Self-pay

## 2018-12-20 MED ORDER — SERTRALINE HCL 100 MG PO TABS: 100.0000 mg | ORAL_TABLET | Freq: Every day | ORAL | 0 refills | Status: DC

## 2018-12-20 MED FILL — SERTRALINE HCL 100 MG TAB: 100 | 30 days supply | Qty: 30 | Fill #1

## 2018-12-25 ENCOUNTER — Ambulatory Visit: Payer: Self-pay

## 2018-12-28 ENCOUNTER — Ambulatory Visit (INDEPENDENT_AMBULATORY_CARE_PROVIDER_SITE_OTHER): Payer: 59 | Admitting: Physician Assistant

## 2018-12-28 ENCOUNTER — Encounter: Payer: Self-pay | Admitting: Physician Assistant

## 2018-12-28 ENCOUNTER — Other Ambulatory Visit: Payer: Self-pay

## 2018-12-28 DIAGNOSIS — G4700 Insomnia, unspecified: Secondary | ICD-10-CM

## 2018-12-28 DIAGNOSIS — R69 Illness, unspecified: Secondary | ICD-10-CM | POA: Diagnosis not present

## 2018-12-28 DIAGNOSIS — F411 Generalized anxiety disorder: Secondary | ICD-10-CM | POA: Diagnosis not present

## 2018-12-28 DIAGNOSIS — F3342 Major depressive disorder, recurrent, in full remission: Secondary | ICD-10-CM

## 2018-12-28 MED ORDER — BUPROPION HCL ER (XL) 150 MG PO TB24: 150.0000 mg | ORAL_TABLET | ORAL | 5 refills | Status: DC

## 2018-12-28 MED ORDER — ALPRAZOLAM 0.5 MG PO TABS: 0.5000 mg | ORAL_TABLET | Freq: Two times a day (BID) | ORAL | 5 refills | Status: DC | PRN

## 2018-12-28 MED ORDER — SERTRALINE HCL 100 MG PO TABS: 100.0000 mg | ORAL_TABLET | Freq: Every day | ORAL | 5 refills | Status: DC

## 2018-12-28 MED FILL — ALPRAZolam 0.5 MG TABS: 0.5 | 30 days supply | Qty: 60 | Fill #0

## 2018-12-28 NOTE — Progress Notes (Signed)
[facility]  Patient ID: Judy Foster,  MRN: [id number]  PCP: Kathyrn Lass, MD  Date of Evaluation: 12/28/2018 Time spent:15 minutes  Chief Complaint:  Chief Complaint    Anxiety; Depression; Follow-up      HISTORY/CURRENT STATUS: HPI for 45-month med check.  States she is doing well.  She is in her first semester of nurse practitioner school at [employer] and that is going well.  She is also working full-time.  She is busy and has had some increased stress due to these things but feels that it is the stress that anyone would feel.  She is able to enjoy things.  Energy and motivation are good.  She sleeps pretty well most of the time sometimes she does take melatonin and Xanax to help with that.  She can have racing thoughts in the evenings sometimes if she does not take the Xanax.  Does not cry easily.  Sexual side effects have improved since being on the Wellbutrin.  Denies suicidal or homicidal thoughts.  Patient denies increased energy with decreased need for sleep, no increased talkativeness, no racing thoughts, no impulsivity or risky behaviors, no increased spending, no increased libido, no grandiosity.  Denies dizziness, syncope, seizures, numbness, tingling, tremor, tics, unsteady gait, slurred speech, confusion. Denies muscle or joint pain, stiffness, or dystonia.  Individual Medical History/ Review of Systems: Changes? :No    Past medications for mental health diagnoses include: Prozac caused drowiness, Wellbutrin for about 2 weeks (stopped b/c pregnancy)Trazodone, Melatonin  Allergies: Penicillins  Current Medications:  Current Outpatient Medications:  .  ALPRAZolam (XANAX) 0.5 MG tablet, Take 1 tablet (0.5 mg total) by mouth 2 (two) times daily as needed for anxiety., Disp: 60 tablet, Rfl: 5 .  buPROPion (WELLBUTRIN XL) 150 MG 24 hr tablet, Take 1 tablet (150 mg total) by mouth every morning., Disp: 30 tablet, Rfl: 5 .  calcium-vitamin D (OSCAL WITH D)  500-200 MG-UNIT tablet, Take 1 tablet by mouth., Disp: , Rfl:  .  Melatonin 3-10 MG TABS, Take by mouth., Disp: , Rfl:  .  sertraline (ZOLOFT) 100 MG tablet, Take 1 tablet (100 mg total) by mouth daily., Disp: 30 tablet, Rfl: 5 .  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   MENTAL HEALTH EXAM:  There were no vitals taken for this visit.There is no height or weight on file to calculate BMI.  General Appearance: Casual, Neat and Well Groomed  Eye Contact:  Good  Speech:  Clear and Coherent  Volume:  Normal  Mood:  Euthymic  Affect:  Appropriate  Thought Process:  Goal Directed and Descriptions of Associations: Intact  Orientation:  Full (Time, Place, and Person)  Thought Content: Logical   Suicidal Thoughts:  No  Homicidal Thoughts:  No  Memory:  WNL  Judgement:  Good  Insight:  Good  Psychomotor Activity:  Normal  Concentration:  Concentration: Good  Recall:  Good  Fund of Knowledge: Good  Language: Good  Assets:  Desire for Improvement  ADL's:  Intact  Cognition: WNL  Prognosis:  Good    DIAGNOSES:    ICD-10-CM   1. Generalized anxiety disorder  F41.1   2. Insomnia, unspecified type  G47.00   3. Recurrent major depressive disorder, in full remission ([place])  F33.42     Receiving Psychotherapy: No    RECOMMENDATIONS:  PDMP was reviewed. She may need to start  having her medications sent to a mail order pharmacy but she is not sure.  She will call if she would like to change pharmacies and we can send in a 56-month supply at that point. We discussed sleep hygiene including Amber colored, blue blocker glasses. Continue Xanax 0.5 mg, 1/2-1 twice daily as needed. Continue Wellbutrin XL 150 mg every morning. Continue Zoloft 100 mg daily. Return in 6 months.  Donnal Moat, PA-C

## 2018-12-31 MED FILL — buPROPion HCL ER (XL) 150 M: 150 | 30 days supply | Qty: 30 | Fill #0

## 2019-01-01 ENCOUNTER — Other Ambulatory Visit: Payer: Self-pay

## 2019-01-01 ENCOUNTER — Ambulatory Visit
Admission: RE | Admit: 2019-01-01 | Discharge: 2019-01-01 | Disposition: A | Discharge status: Home / Self Care | Payer: 59 | Source: Ambulatory Visit | Attending: Certified Nurse Midwife | Admitting: Certified Nurse Midwife

## 2019-01-01 DIAGNOSIS — Z1231 Encounter for screening mammogram for malignant neoplasm of breast: Secondary | ICD-10-CM

## 2019-01-22 DIAGNOSIS — E663 Overweight: Secondary | ICD-10-CM | POA: Diagnosis not present

## 2019-01-22 DIAGNOSIS — E785 Hyperlipidemia, unspecified: Secondary | ICD-10-CM | POA: Diagnosis not present

## 2019-01-24 DIAGNOSIS — E785 Hyperlipidemia, unspecified: Secondary | ICD-10-CM | POA: Diagnosis not present

## 2019-01-24 DIAGNOSIS — E663 Overweight: Secondary | ICD-10-CM | POA: Diagnosis not present

## 2019-01-25 MED FILL — SERTRALINE HCL 100 MG TAB: 100 | 30 days supply | Qty: 30 | Fill #1

## 2019-02-06 MED FILL — buPROPion HCL ER (XL) 150 M: 150 | 30 days supply | Qty: 30 | Fill #1

## 2019-02-10 ENCOUNTER — Other Ambulatory Visit: Payer: Self-pay

## 2019-02-10 MED ORDER — BUPROPION HCL ER (XL) 150 MG PO TB24: 150.0000 mg | ORAL_TABLET | ORAL | 1 refills | Status: DC

## 2019-02-10 MED ORDER — SERTRALINE HCL 100 MG PO TABS: 100.0000 mg | ORAL_TABLET | Freq: Every day | ORAL | 1 refills | Status: DC

## 2019-05-10 ENCOUNTER — Encounter: Payer: Self-pay | Admitting: Certified Nurse Midwife

## 2019-05-13 MED FILL — ALPRAZolam 0.5 MG TABS: 0.5 | 30 days supply | Qty: 60 | Fill #1

## 2019-06-27 ENCOUNTER — Ambulatory Visit: Payer: 59 | Admitting: Physician Assistant

## 2019-07-10 ENCOUNTER — Other Ambulatory Visit: Payer: Self-pay

## 2019-07-10 ENCOUNTER — Ambulatory Visit (INDEPENDENT_AMBULATORY_CARE_PROVIDER_SITE_OTHER): Payer: 59 | Admitting: Physician Assistant

## 2019-07-10 ENCOUNTER — Encounter: Payer: Self-pay | Admitting: Physician Assistant

## 2019-07-10 DIAGNOSIS — F3342 Major depressive disorder, recurrent, in full remission: Secondary | ICD-10-CM

## 2019-07-10 DIAGNOSIS — F411 Generalized anxiety disorder: Secondary | ICD-10-CM

## 2019-07-10 DIAGNOSIS — R69 Illness, unspecified: Secondary | ICD-10-CM | POA: Diagnosis not present

## 2019-07-10 MED ORDER — SERTRALINE HCL 100 MG PO TABS: 100.0000 mg | ORAL_TABLET | Freq: Every day | ORAL | 1 refills | Status: DC

## 2019-07-10 MED ORDER — ALPRAZOLAM 0.5 MG PO TABS: 0.5000 mg | ORAL_TABLET | Freq: Two times a day (BID) | ORAL | 2 refills | Status: DC | PRN

## 2019-07-10 MED FILL — ALPRAZolam 0.5 MG TABS: 0.5 | 30 days supply | Qty: 60 | Fill #0

## 2019-07-10 NOTE — Progress Notes (Signed)
[facility]  Patient ID: MARSHALL SPEISER,  MRN: [id number]  PCP: Kathyrn Lass, MD  Date of Evaluation: 07/10/2019 Time spent:20 minutes  Chief Complaint:  Chief Complaint    Anxiety      HISTORY/CURRENT STATUS: HPI For 6 month med check.  Judy Foster is doing well.  She would like to go off of the Wellbutrin.  We started that initially in hopes to negate the increased hunger and weight gain from the Zoloft but it has not helped at all.  She does not want to take something if it is not effective, which is understandable.  She is able to enjoy things.  Energy and motivation are good.  Focus and concentration are normal.  Work is going fine.  She is currently on vacation and she and her family are going camping, looking forward to that.  Anxiety is well controlled.  She does get panicky on occasion but states the Xanax does help when that occurs.  She is able to use coping techniques that she is learned and is not always needing Xanax.  Also states it is helpful to know that she is not having a heart attack which makes the anxiety even worse.  She will ruminate sometimes especially in the evenings and will take a Xanax to help her relax and go to sleep.  That is helpful as well.  No SI/HI.  Denies dizziness, syncope, seizures, numbness, tingling, tremor, tics, unsteady gait, slurred speech, confusion. Denies muscle or joint pain, stiffness, or dystonia.  Individual Medical History/ Review of Systems: Changes? :No    Past medications for mental health diagnoses include: Prozac caused drowiness, Wellbutrin for about 2 weeks (stopped b/c pregnancy)was retried for weight management from Zoloft but ineffective, Trazodone, Melatonin  Allergies: Penicillins  Current Medications:  Current Outpatient Medications:  .  ALPRAZolam (XANAX) 0.5 MG tablet, Take 1 tablet (0.5 mg total) by mouth 2 (two) times daily as needed for anxiety., Disp: 120 tablet, Rfl: 2 .  calcium-vitamin D (OSCAL  WITH D) 500-200 MG-UNIT tablet, Take 1 tablet by mouth., Disp: , Rfl:  .  Melatonin 3-10 MG TABS, Take by mouth., Disp: , Rfl:  .  sertraline (ZOLOFT) 100 MG tablet, Take 1 tablet (100 mg total) by mouth daily., Disp: 90 tablet, Rfl: 1 .  spironolactone (ALDACTONE) 100 MG tablet, , Disp: , Rfl:  Medication Side Effects: none  Family Medical/ Social History: Changes? No  MENTAL HEALTH EXAM:  There were no vitals taken for this visit.There is no height or weight on file to calculate BMI.  General Appearance: Casual, Neat and Well Groomed  Eye Contact:  Good  Speech:  Clear and Coherent and Normal Rate  Volume:  Normal  Mood:  Euthymic  Affect:  Appropriate  Thought Process:  Goal Directed and Descriptions of Associations: Intact  Orientation:  Full (Time, Place, and Person)  Thought Content: Logical   Suicidal Thoughts:  No  Homicidal Thoughts:  No  Memory:  WNL  Judgement:  Good  Insight:  Good  Psychomotor Activity:  Normal  Concentration:  Concentration: Good  Recall:  Good  Fund of Knowledge: Good  Language: Good  Assets:  Desire for Improvement  ADL's:  Intact  Cognition: WNL  Prognosis:  Good    DIAGNOSES:    ICD-10-CM   1. Generalized anxiety disorder  F41.1   2. Recurrent major depressive disorder, in full remission ([place])  F33.42     Receiving Psychotherapy: No    RECOMMENDATIONS:  PDMP  was reviewed. I spent 20 minutes with her. Wean off Wellbutrin XL 150 mg, take one half p.o. daily for 10 days and then stop.  If she is having any withdrawals such as dizziness queasiness, etc. call and I will will prescribe a short acting lower dose Wellbutrin and we will wean slower.  She understands. Continue Zoloft 100 mg, 1 p.o. daily. Continue Xanax 0.5 mg, 1 p.o. twice daily. Return in 6 months.  Donnal Moat, PA-C

## 2019-07-25 MED FILL — ALPRAZolam 0.5 MG TABS: 0.5 | 30 days supply | Qty: 60 | Fill #0

## 2019-07-30 DIAGNOSIS — Z111 Encounter for screening for respiratory tuberculosis: Secondary | ICD-10-CM | POA: Diagnosis not present

## 2019-08-10 ENCOUNTER — Other Ambulatory Visit: Payer: Self-pay | Admitting: Physician Assistant

## 2019-08-19 ENCOUNTER — Other Ambulatory Visit: Payer: Self-pay | Admitting: Physician Assistant

## 2019-10-04 MED FILL — ALPRAZolam 0.5 MG TABS: 0.5 | 30 days supply | Qty: 60 | Fill #1

## 2019-10-23 ENCOUNTER — Ambulatory Visit: Payer: Self-pay | Admitting: Certified Nurse Midwife

## 2019-12-16 MED FILL — ALPRAZolam 0.5 MG TABS: 0.5 | 30 days supply | Qty: 60 | Fill #2

## 2020-01-07 ENCOUNTER — Ambulatory Visit: Payer: 59 | Admitting: Physician Assistant

## 2020-01-08 ENCOUNTER — Ambulatory Visit (INDEPENDENT_AMBULATORY_CARE_PROVIDER_SITE_OTHER): Payer: 59 | Admitting: Physician Assistant

## 2020-01-08 ENCOUNTER — Other Ambulatory Visit: Payer: Self-pay

## 2020-01-08 ENCOUNTER — Encounter: Payer: Self-pay | Admitting: Physician Assistant

## 2020-01-08 ENCOUNTER — Other Ambulatory Visit: Payer: Self-pay | Admitting: Physician Assistant

## 2020-01-08 DIAGNOSIS — G4700 Insomnia, unspecified: Secondary | ICD-10-CM | POA: Diagnosis not present

## 2020-01-08 DIAGNOSIS — R69 Illness, unspecified: Secondary | ICD-10-CM | POA: Diagnosis not present

## 2020-01-08 DIAGNOSIS — F331 Major depressive disorder, recurrent, moderate: Secondary | ICD-10-CM | POA: Diagnosis not present

## 2020-01-08 DIAGNOSIS — F429 Obsessive-compulsive disorder, unspecified: Secondary | ICD-10-CM | POA: Diagnosis not present

## 2020-01-08 MED ORDER — SERTRALINE HCL 25 MG PO TABS: ORAL_TABLET | ORAL | 0 refills | Status: DC

## 2020-01-08 MED ORDER — ALPRAZOLAM 0.5 MG PO TABS: 0.5000 mg | ORAL_TABLET | Freq: Two times a day (BID) | ORAL | 2 refills | Status: DC | PRN

## 2020-01-08 MED ORDER — BUPROPION HCL ER (XL) 150 MG PO TB24: 150.0000 mg | ORAL_TABLET | Freq: Every day | ORAL | 1 refills | Status: DC

## 2020-01-08 MED FILL — SERTRALINE HCL 25 MG TABLET: 25 | 21 days supply | Qty: 42 | Fill #0

## 2020-01-08 MED FILL — buPROPion HCL ER (XL) 150 M: 150 | 30 days supply | Qty: 30 | Fill #0

## 2020-01-08 NOTE — Progress Notes (Signed)
[facility]  Patient ID: Judy Foster,  MRN: [id number]  PCP: Kathyrn Lass, MD  Date of Evaluation: 01/08/2020 Time spent:30 minutes  Chief Complaint:  Chief Complaint    Anxiety; Depression      HISTORY/CURRENT STATUS: HPI For 6 month med check.  At the last visit in May, we weaned off the Wellbutrin which we had initially started to help with weight loss.  At the time, we did not think it was helping with her mood.  But now it seems that it was more effective than we thought.  For the past several months, she has been more irritable, just not herself. 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  Is looking for reasons to call out of work, which she never does.  Having trouble sleeping now both going to sleep and staying asleep.  Goes to the extreme emotionally when anything happens, whether it is good or bad.  She is able to enjoy some things, especially with her family but just feels overwhelmed overall.  She does not have a lot of energy or motivation at all right now.  If she could, she would stay at home most all the time.  No suicidal or homicidal thoughts.  Patient denies increased energy with decreased need for sleep, no increased talkativeness, No impulsivity or risky behaviors, no increased spending, no increased libido, no grandiosity, no increased irritability or anger, no paranoia, and no hallucinations.  Anxiety is controlled for the most part.  She does still use the Xanax and that is helpful.  She has noticed that she sweats quite a bit and is not sure if that is due to anxiety.  It can happen for no reason, as when she is not anxious.  no racing thoughts although she can get fixated on something and is not able to let go of it easily.  It seems to be more related to school. Otherwise OCD symptoms are better.  Denies dizziness, syncope, seizures, numbness, tingling, tremor, tics, unsteady gait, slurred  speech, confusion. Denies muscle or joint pain, stiffness, or dystonia.  Individual Medical History/ Review of Systems: Changes? :No    Past medications for mental health diagnoses include: Prozac caused drowiness, Wellbutrin, Zoloft seemed to work for [employer], trazodone, Melatonin  Allergies: Penicillins  Current Medications:  Current Outpatient Medications:    ALPRAZolam (XANAX) 0.5 MG tablet, Take 1 tablet (0.5 mg total) by mouth 2 (two) times daily as needed for anxiety., Disp: 120 tablet, Rfl: 2   Melatonin 3-10 MG TABS, Take by mouth., Disp: , Rfl:    buPROPion (WELLBUTRIN XL) 150 MG 24 hr tablet, Take 1 tablet (150 mg total) by mouth daily., Disp: 30 tablet, Rfl: 1   sertraline (ZOLOFT) 25 MG tablet, 3 po qd for 1 week, then 2 po qd for 1 week, then 1 po qd for 1 wk, then stop., Disp: 42 tablet, Rfl: 0   spironolactone (ALDACTONE) 100 MG tablet, , Disp: , Rfl:  Medication Side Effects: sweating  Family Medical/ Social History: Changes? No  MENTAL HEALTH EXAM:  There were no vitals taken for this visit.There is no height or weight on file to calculate BMI.  General Appearance: Casual, Neat and Well Groomed  Eye Contact:  Good  Speech:  Clear and Coherent and Normal Rate  Volume:  Normal  Mood:  Depressed  Affect:  Depressed  Thought Process:  Goal Directed and Descriptions of Associations: Intact  Orientation:  Full (  Time, Place, and Person)  Thought Content: Logical   Suicidal Thoughts:  No  Homicidal Thoughts:  No  Memory:  WNL  Judgement:  Good  Insight:  Good  Psychomotor Activity:  Normal  Concentration:  Concentration: Good  Recall:  Good  Fund of Knowledge: Good  Language: Good  Assets:  Desire for Improvement  ADL's:  Intact  Cognition: WNL  Prognosis:  Good    DIAGNOSES:    ICD-10-CM   1. Major depressive disorder, recurrent episode, moderate (HCC)  F33.1   2. Insomnia, unspecified type  G47.00   3. Obsessive-compulsive disorder, unspecified type   F42.9     Receiving Psychotherapy: No    RECOMMENDATIONS:  PDMP was reviewed. I provided 30 minutes of face-to-face time during this encounter. We discussed her symptoms at length.  First of all it does not seem like the Zoloft has been particularly helpful with the symptoms of depression, at least not for now.  And it is causing increased sweating I believe.  So we should wean off of it and restart the Wellbutrin.  We had originally given the Wellbutrin as an adjunct to the Zoloft, in hopes that it would help decrease appetite and cause weight loss.  It was not effective for those symptoms, but now it does seem it may have been helping with the depression more than we knew.  We agree to make these changes.  She knows to contact me if she becomes more anxious and it does not improve after 1 to 2 weeks at the most. Wean off Zoloft 25 mg, 3 p.o. daily for 1 week, then 2 p.o. daily for 1 week, then 1 p.o. daily for 1 week and then stop. Restart Wellbutrin XL 150 mg, 1 p.o. daily. Continue Xanax 0.5 mg, 1 p.o. twice daily. Recommend counseling. Return in 4 weeks.  Donnal Moat, PA-C

## 2020-02-04 MED FILL — buPROPion HCL ER (XL) 150 M: 150 | 30 days supply | Qty: 30 | Fill #1

## 2020-02-19 MED FILL — ALPRAZolam 0.5 MG TABS: 0.5 | 30 days supply | Qty: 60 | Fill #0

## 2020-02-25 ENCOUNTER — Encounter: Payer: Self-pay | Admitting: Physician Assistant

## 2020-02-25 ENCOUNTER — Ambulatory Visit (INDEPENDENT_AMBULATORY_CARE_PROVIDER_SITE_OTHER): Payer: 59 | Admitting: Physician Assistant

## 2020-02-25 ENCOUNTER — Other Ambulatory Visit: Payer: Self-pay

## 2020-02-25 ENCOUNTER — Other Ambulatory Visit: Payer: Self-pay | Admitting: Physician Assistant

## 2020-02-25 DIAGNOSIS — F429 Obsessive-compulsive disorder, unspecified: Secondary | ICD-10-CM

## 2020-02-25 DIAGNOSIS — G4700 Insomnia, unspecified: Secondary | ICD-10-CM | POA: Diagnosis not present

## 2020-02-25 DIAGNOSIS — R69 Illness, unspecified: Secondary | ICD-10-CM | POA: Diagnosis not present

## 2020-02-25 DIAGNOSIS — F411 Generalized anxiety disorder: Secondary | ICD-10-CM | POA: Diagnosis not present

## 2020-02-25 DIAGNOSIS — R454 Irritability and anger: Secondary | ICD-10-CM

## 2020-02-25 MED ORDER — VIIBRYD 20 MG PO TABS: 20.0000 mg | ORAL_TABLET | Freq: Every morning | ORAL | 1 refills | Status: DC

## 2020-02-25 NOTE — Progress Notes (Signed)
[facility]  Patient ID: Judy Foster,  MRN: [id number]  PCP: Judy Lass, MD  Date of Evaluation: 02/25/2020 Time spent:40 minutes  Chief Complaint:  Chief Complaint    Follow-up      HISTORY/CURRENT STATUS: HPI For routine med check.  Around 6 wks ago, we restarted Wellbutrin and weaned off Zoloft. Better in that sexual side effects are gone and feels more motivated.  But has been extremely angry, feels like she could bite someone's head off over nothing.  Has never felt this way before.  She and her husband got in an argument a few days ago over something that she feels like now was a minor issue but she made it so much better than it was.  She could not sleep that night because of the racing thoughts and anger that she felt.  And on any given night she cannot sleep unless she takes the Xanax and melatonin.  She was not this way before going on the Wellbutrin.  Anxiety is not really an issue except for the fact that she gets upset with herself for reacting the way she does.  Her OCD symptoms have been much worse since going off the Zoloft.  1 example is that the comforter on their bed has vertical stripes and she spends way too much time trying to get them straight.  And after she and her husband get in bed she still takes time trying to straighten the stripes.  She even gets angry at her husband because he does not "see that they are uneven."   She is able to enjoy some things.  As noted above, energy and motivation are better.  She is not isolating.  Is more sensitive and a bit more tearful over things than she was when she was on Zoloft.  No suicidal or homicidal thoughts.  Patient denies increased energy with decreased need for sleep, no increased talkativeness, no racing thoughts, no impulsivity or risky behaviors, no increased spending, no increased libido, no grandiosity, no increased irritability or anger, and no hallucinations.  Denies dizziness, syncope,  seizures, numbness, tingling, tremor, tics, unsteady gait, slurred speech, confusion. Denies muscle or joint pain, stiffness, or dystonia.  Individual Medical History/ Review of Systems: Changes? :No    Past medications for mental health diagnoses include: Prozac caused drowiness, Wellbutrin, Zoloft seemed to work for Judy Foster, trazodone, Melatonin  Allergies: Penicillins  Current Medications:  Current Outpatient Medications:  .  ALPRAZolam (XANAX) 0.5 MG tablet, Take 1 tablet (0.5 mg total) by mouth 2 (two) times daily as needed for anxiety., Disp: 120 tablet, Rfl: 2 .  Melatonin 3-10 MG TABS, Take by mouth., Disp: , Rfl:  .  Multiple Vitamin (MULTIVITAMIN) tablet, Take 1 tablet by mouth daily., Disp: , Rfl:  .  Vilazodone HCl (VIIBRYD) 20 MG TABS, Take 1 tablet (20 mg total) by mouth in the morning., Disp: 30 tablet, Rfl: 1 .  spironolactone (ALDACTONE) 100 MG tablet, , Disp: , Rfl:  Medication Side Effects: none  Family Medical/ Social History: Changes? No  MENTAL HEALTH EXAM:  There were no vitals taken for this visit.There is no height or weight on file to calculate BMI.  General Appearance: Casual, Neat and Well Groomed  Eye Contact:  Good  Speech:  Clear and Coherent and Normal Rate  Volume:  Normal  Mood:  Anxious  Affect:  Anxious  Thought Process:  Goal Directed and Descriptions of Associations: Intact  Orientation:  Full (Time, Place, and Person)  Thought Content: Logical   Suicidal Thoughts:  No  Homicidal Thoughts:  No  Memory:  WNL  Judgement:  Good  Insight:  Good  Psychomotor Activity:  Normal  Concentration:  Concentration: Good  Recall:  Good  Fund of Knowledge: Good  Language: Good  Assets:  Desire for Improvement  ADL's:  Intact  Cognition: WNL  Prognosis:  Good    DIAGNOSES:    ICD-10-CM   1. Obsessive-compulsive disorder, unspecified type  F42.9   2. Irritability and anger  R45.4   3. Generalized anxiety disorder  F41.1   4. Insomnia,  unspecified type  G47.00     Receiving Psychotherapy: No    RECOMMENDATIONS:  PDMP was reviewed. I provided 40 minutes of face-to-face time during this encounter during which time we discussed the side effects of increased anger and irritability due to the Wellbutrin.  I discussed her case with Dr. Jennelle Human who recommended several options.  One was to decrease the Wellbutrin to see if that would still be helpful with mood but get decrease the symptoms caused by increased norepinephrine.  Another option would be to add in an SSRI which will help with those symptoms but also probably reintroduce the sexual side effects.  Adding Viibryd is a good option as it is a newer modulator of serotonin and does not cause weight gain or sexual side effects most of the time.  Trileptal or Abilify would also be options. After discussing these options with Judy Foster, we agreed to add Viibryd.  Benefits, risks, side effects were discussed and she accepts.  She was given a 2-week starter pack of 10 mg for 1 week and then 20 mg for the next week and then a prescription for 20 mg was sent in.   Wean off Wellbutrin XL 150 mg by taking one half p.o. every morning for 4 to 6 days and then stop. Start Viibryd as noted above. Continue Xanax 0.5 mg, 1 p.o. twice daily. Continue melatonin OTC as needed. Recommend counseling. Return in 4-6 weeks.  Judy Overly, PA-C

## 2020-03-06 ENCOUNTER — Other Ambulatory Visit: Payer: Self-pay | Admitting: Certified Nurse Midwife

## 2020-03-06 DIAGNOSIS — Z1231 Encounter for screening mammogram for malignant neoplasm of breast: Secondary | ICD-10-CM

## 2020-03-24 ENCOUNTER — Ambulatory Visit
Admission: RE | Admit: 2020-03-24 | Discharge: 2020-03-24 | Disposition: A | Discharge status: Home / Self Care | Payer: 59 | Source: Ambulatory Visit

## 2020-03-24 ENCOUNTER — Other Ambulatory Visit: Payer: Self-pay

## 2020-03-24 DIAGNOSIS — Z1231 Encounter for screening mammogram for malignant neoplasm of breast: Secondary | ICD-10-CM

## 2020-03-25 ENCOUNTER — Ambulatory Visit (INDEPENDENT_AMBULATORY_CARE_PROVIDER_SITE_OTHER): Payer: 59 | Admitting: Advanced Practice Midwife

## 2020-03-25 ENCOUNTER — Other Ambulatory Visit (HOSPITAL_COMMUNITY)
Admission: RE | Admit: 2020-03-25 | Discharge: 2020-03-25 | Disposition: A | Discharge status: Home / Self Care | Payer: 59 | Source: Ambulatory Visit | Attending: Advanced Practice Midwife | Admitting: Advanced Practice Midwife

## 2020-03-25 ENCOUNTER — Encounter: Payer: Self-pay | Admitting: Advanced Practice Midwife

## 2020-03-25 VITALS — BP 125/82 | HR 76 | Ht 69.0 in | Wt 216.0 lb

## 2020-03-25 DIAGNOSIS — Z01419 Encounter for gynecological examination (general) (routine) without abnormal findings: Secondary | ICD-10-CM | POA: Insufficient documentation

## 2020-03-25 DIAGNOSIS — Z9189 Other specified personal risk factors, not elsewhere classified: Secondary | ICD-10-CM | POA: Diagnosis not present

## 2020-03-25 DIAGNOSIS — F411 Generalized anxiety disorder: Secondary | ICD-10-CM | POA: Diagnosis not present

## 2020-03-25 DIAGNOSIS — R69 Illness, unspecified: Secondary | ICD-10-CM | POA: Diagnosis not present

## 2020-03-25 NOTE — Progress Notes (Signed)
GYNECOLOGY ANNUAL PREVENTATIVE CARE ENCOUNTER NOTE  History:     Judy Foster is a 45 y.o. [phone] female here for a routine annual gynecologic exam.  Current complaints: none.   Denies abnormal vaginal bleeding, discharge, pelvic pain, problems with intercourse or other gynecologic concerns.     Non-smoker, lives with husband and kids. 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  Gynecologic History No LMP recorded. Contraception: vasectomy Last Pap: 06/2017. Results were: normal with negative HPV Last mammogram: 03/24/2020. Results were: normal  Obstetric History OB History  Gravida Para Term Preterm AB Living  3 2     1 2   SAB IAB Ectopic Multiple Live Births  1       2    # Outcome Date GA Lbr Len/2nd Weight Sex Delivery Anes PTL Lv  3 Para 12/2009    M Vag-Spont   LIV  2 Para 09/2005    F Vag-Spont   LIV  1 SAB             Past Medical History:  Diagnosis Date  . Anal fissure   . Anxiety   . Asthma, exercise induced    as a teen  . Cardiac arrhythmia   . Depression   . Dysmenorrhea    past  . HLD (hyperlipidemia)   . Mononucleosis 1993  . Shingles     Past Surgical History:  Procedure Laterality Date  . CRANIECTOMY SUBOCCIPITAL W/ CERVICAL LAMINECTOMY / CHIARI      Current Outpatient Medications on File Prior to Visit  Medication Sig Dispense Refill  . ALPRAZolam (XANAX) 0.5 MG tablet Take 1 tablet (0.5 mg total) by mouth 2 (two) times daily as needed for anxiety. 120 tablet 2  . Melatonin 3-10 MG TABS Take by mouth.    . Multiple Vitamin (MULTIVITAMIN) tablet Take 1 tablet by mouth daily.    . Vilazodone HCl (VIIBRYD) 20 MG TABS Take 1 tablet (20 mg total) by mouth in the morning. 30 tablet 1  . spironolactone (ALDACTONE) 100 MG tablet      No current facility-administered medications on file prior to visit.    Allergies  Allergen Reactions  . Penicillins Rash    Social History:  reports that she has never smoked. She has never  used smokeless tobacco. She reports that she does not drink alcohol and does not use drugs.  Family History  Problem Relation Age of Onset  . Colon cancer Maternal Grandmother   . Thyroid disease Maternal Grandmother   . Stroke Maternal Grandmother   . Kidney disease Maternal Grandmother   . Leukemia Maternal Grandfather   . Heart disease Maternal Grandfather   . Hypertension Mother   . Thyroid disease Mother        hypothyroid  . Colon polyps Mother   . Diabetes Mother   . Breast cancer Mother 92    The following portions of the patient's history were reviewed and updated as appropriate: allergies, current medications, past family history, past medical history, past social history, past surgical history and problem list.  Review of Systems Pertinent items noted in HPI and remainder of comprehensive ROS otherwise negative.  Physical Exam:  BP 125/82   Pulse 76   Ht 5\' 9"  (1.753 m)   Wt 216 lb (98 kg)   BMI 31.90 kg/m  CONSTITUTIONAL: Well-developed, well-nourished female in no acute distress.  HENT:  Normocephalic, atraumatic, External right and left ear normal.  EYES: Conjunctivae  and EOM are normal. Pupils are equal, round, and reactive to light. No scleral icterus.  NECK: Normal range of motion, supple, no masses.  Normal thyroid.  SKIN: Skin is warm and dry. No rash noted. Not diaphoretic. No erythema. No pallor. MUSCULOSKELETAL: Normal range of motion. No tenderness.  No cyanosis, clubbing, or edema.   NEUROLOGIC: Alert and oriented to person, place, and time. Normal reflexes, muscle tone coordination.  PSYCHIATRIC: Normal mood and affect. Normal behavior. Normal judgment and thought content. CARDIOVASCULAR: Normal heart rate noted, regular rhythm RESPIRATORY: Clear to auscultation bilaterally. Effort and breath sounds normal, no problems with respiration noted. BREASTS: Symmetric in size. No masses, tenderness, skin changes, nipple drainage, or lymphadenopathy  bilaterally. Performed in the presence of a chaperone. ABDOMEN: Soft, no distention noted.  No tenderness, rebound or guarding.  PELVIC: Normal appearing external genitalia and urethral meatus; normal appearing vaginal mucosa and cervix.  No abnormal discharge noted.  Pap smear obtained.  Performed in the presence of a chaperone.   Assessment and Plan:    1. Well woman exam with routine gynecological exam - No acute concerns - Welcomed to practice! - S/p normal mammogram yesterday - CBC - TSH - Hemoglobin A1c - Comprehensive metabolic panel - Lipid panel - Cytology - PAP  2. GAD (generalized anxiety disorder) - Xanax at bedtime, managed by psychiatrist - Reassured she is at very low dose, ok to continue PRN unless strongly advised otherwise by Psych  Will follow up results of pap smear and manage accordingly. Routine preventative health maintenance measures emphasized. Please refer to After Visit Summary for other counseling recommendations.     Total visit time 30 minutes. Greater than 50% of visit spent in counseling and coordination of care  Mallie Snooks, MSN, CNM Certified Nurse Midwife, [facility] for [employer], [place] 03/25/20 11:58 AM

## 2020-03-25 NOTE — Patient Instructions (Signed)
Preventive Care 43-43 Years Old, Female Preventive care refers to lifestyle choices and visits with your health care provider that can promote health and wellness. This includes:  A yearly physical exam. This is also called an annual wellness visit.  Regular dental and eye exams.  Immunizations.  Screening for certain conditions.  Healthy lifestyle choices, such as: ? Eating a healthy diet. ? Getting regular exercise. ? Not using drugs or products that contain nicotine and tobacco. ? Limiting alcohol use. What can I expect for my preventive care visit? Physical exam Your health care provider will check your:  Height and weight. These may be used to calculate your BMI (body mass index). BMI is a measurement that tells if you are at a healthy weight.  Heart rate and blood pressure.  Body temperature.  Skin for abnormal spots. Counseling Your health care provider may ask you questions about your:  Past medical problems.  Family's medical history.  Alcohol, tobacco, and drug use.  Emotional well-being.  Home life and relationship well-being.  Sexual activity.  Diet, exercise, and sleep habits.  Work and work Statistician.  Access to firearms.  Method of birth control.  Menstrual cycle.  Pregnancy history. What immunizations do I need? Vaccines are usually given at various ages, according to a schedule. Your health care provider will recommend vaccines for you based on your age, medical history, and lifestyle or other factors, such as travel or where you work.   What tests do I need? Blood tests  Lipid and cholesterol levels. These may be checked every 5 years, or more often if you are over 43 years old.  Hepatitis C test.  Hepatitis B test. Screening  Lung cancer screening. You may have this screening every year starting at age 43 if you have a 30-pack-year history of smoking and currently smoke or have quit within the past 15 years.  Colorectal cancer  screening. ? All adults should have this screening starting at age 43 and continuing until age 17. ? Your health care provider may recommend screening at age 43 if you are at increased risk. ? You will have tests every 1-10 years, depending on your results and the type of screening test.  Diabetes screening. ? This is done by checking your blood sugar (glucose) after you have not eaten for a while (fasting). ? You may have this done every 1-3 years.  Mammogram. ? This may be done every 1-2 years. ? Talk with your health care provider about when you should start having regular mammograms. This may depend on whether you have a family history of breast cancer.  BRCA-related cancer screening. This may be done if you have a family history of breast, ovarian, tubal, or peritoneal cancers.  Pelvic exam and Pap test. ? This may be done every 3 years starting at age 10. ? Starting at age 11, this may be done every 5 years if you have a Pap test in combination with an HPV test. Other tests  STD (sexually transmitted disease) testing, if you are at risk.  Bone density scan. This is done to screen for osteoporosis. You may have this scan if you are at high risk for osteoporosis. Talk with your health care provider about your test results, treatment options, and if necessary, the need for more tests. Follow these instructions at home: Eating and drinking  Eat a diet that includes fresh fruits and vegetables, whole grains, lean protein, and low-fat dairy products.  Take vitamin and mineral supplements  as recommended by your health care provider.  Do not drink alcohol if: ? Your health care provider tells you not to drink. ? You are pregnant, may be pregnant, or are planning to become pregnant.  If you drink alcohol: ? Limit how much you have to 0-1 drink a day. ? Be aware of how much alcohol is in your drink. In the [place] one drink equals one 12 oz bottle of beer (355 mL), one 5 oz glass of  wine (148 mL), or one 1 oz glass of hard liquor (44 mL).   Lifestyle  Take daily care of your teeth and gums. Brush your teeth every morning and night with fluoride toothpaste. Floss one time each day.  Stay active. Exercise for at least 30 minutes 5 or more days each week.  Do not use any products that contain nicotine or tobacco, such as cigarettes, e-cigarettes, and chewing tobacco. If you need help quitting, ask your health care provider.  Do not use drugs.  If you are sexually active, practice safe sex. Use a condom or other form of protection to prevent STIs (sexually transmitted infections).  If you do not wish to become pregnant, use a form of birth control. If you plan to become pregnant, see your health care provider for a prepregnancy visit.  If told by your health care provider, take low-dose aspirin daily starting at age 50.  Find healthy ways to cope with stress, such as: ? Meditation, yoga, or listening to music. ? Journaling. ? Talking to a trusted person. ? Spending time with friends and family. Safety  Always wear your seat belt while driving or riding in a vehicle.  Do not drive: ? If you have been drinking alcohol. Do not ride with someone who has been drinking. ? When you are tired or distracted. ? While texting.  Wear a helmet and other protective equipment during sports activities.  If you have firearms in your house, make sure you follow all gun safety procedures. What's next?  Visit your health care provider once a year for an annual wellness visit.  Ask your health care provider how often you should have your eyes and teeth checked.  Stay up to date on all vaccines. This information is not intended to replace advice given to you by your health care provider. Make sure you discuss any questions you have with your health care provider. Document Revised: 11/12/2019 Document Reviewed: 10/19/2017 Elsevier Patient Education  2021 Elsevier Inc.  

## 2020-03-26 ENCOUNTER — Encounter: Payer: Self-pay | Admitting: Advanced Practice Midwife

## 2020-03-26 DIAGNOSIS — R7303 Prediabetes: Secondary | ICD-10-CM | POA: Insufficient documentation

## 2020-03-26 LAB — CBC
Hematocrit: 41.3 % (ref 34.0–46.6)
Hemoglobin: 13.8 g/dL (ref 11.1–15.9)
MCH: 31.5 pg (ref 26.6–33.0)
MCHC: 33.4 g/dL (ref 31.5–35.7)
MCV: 94 fL (ref 79–97)
Platelets: 305 10*3/uL (ref 150–450)
RBC: 4.38 x10E6/uL (ref 3.77–5.28)
RDW: 12.3 % (ref 11.7–15.4)
WBC: 6.4 10*3/uL (ref 3.4–10.8)

## 2020-03-26 LAB — COMPREHENSIVE METABOLIC PANEL
ALT: 25 IU/L (ref 0–32)
AST: 21 IU/L (ref 0–40)
Albumin/Globulin Ratio: 1.8 (ref 1.2–2.2)
Albumin: 4.9 g/dL — ABNORMAL HIGH (ref 3.8–4.8)
Alkaline Phosphatase: 57 IU/L (ref 44–121)
BUN/Creatinine Ratio: 14 (ref 9–23)
BUN: 13 mg/dL (ref 6–24)
Bilirubin Total: 0.4 mg/dL (ref 0.0–1.2)
CO2: 21 mmol/L (ref 20–29)
Calcium: 9.7 mg/dL (ref 8.7–10.2)
Chloride: 99 mmol/L (ref 96–106)
Creatinine, Ser: 0.9 mg/dL (ref 0.57–1.00)
GFR calc Af Amer: 91 mL/min/{1.73_m2} (ref >59)
GFR calc non Af Amer: 79 mL/min/{1.73_m2} (ref >59)
Globulin, Total: 2.8 g/dL (ref 1.5–4.5)
Glucose: 110 mg/dL — ABNORMAL HIGH (ref 65–99)
Potassium: 3.9 mmol/L (ref 3.5–5.2)
Sodium: 137 mmol/L (ref 134–144)
Total Protein: 7.7 g/dL (ref 6.0–8.5)

## 2020-03-26 LAB — HEMOGLOBIN A1C
Est. average glucose Bld gHb Est-mCnc: 120 mg/dL
Hgb A1c MFr Bld: 5.8 % — ABNORMAL HIGH (ref 4.8–5.6)

## 2020-03-26 LAB — LIPID PANEL
Chol/HDL Ratio: 6.2 ratio — ABNORMAL HIGH (ref 0.0–4.4)
Cholesterol, Total: 273 mg/dL — ABNORMAL HIGH (ref 100–199)
HDL: 44 mg/dL (ref >39)
LDL Chol Calc (NIH): 188 mg/dL — ABNORMAL HIGH (ref 0–99)
Triglycerides: 214 mg/dL — ABNORMAL HIGH (ref 0–149)
VLDL Cholesterol Cal: 41 mg/dL — ABNORMAL HIGH (ref 5–40)

## 2020-03-26 LAB — TSH: TSH: 2.63 u[IU]/mL (ref 0.450–4.500)

## 2020-03-31 LAB — CYTOLOGY - PAP
Comment: NEGATIVE
Diagnosis: UNDETERMINED — AB
High risk HPV: NEGATIVE

## 2020-04-02 ENCOUNTER — Telehealth: Payer: Self-pay

## 2020-04-02 NOTE — Telephone Encounter (Signed)
Pt sent my chart message questioning pap smear results and stating she has some spotting after the pap smear. Informed pt that spotting after a pap smear is normal even if she has never experienced that before. Explained pap results to patient and she has no further questions.

## 2020-04-10 ENCOUNTER — Ambulatory Visit (INDEPENDENT_AMBULATORY_CARE_PROVIDER_SITE_OTHER): Payer: 59 | Admitting: Physician Assistant

## 2020-04-10 ENCOUNTER — Encounter: Payer: Self-pay | Admitting: Physician Assistant

## 2020-04-10 ENCOUNTER — Other Ambulatory Visit: Payer: Self-pay

## 2020-04-10 ENCOUNTER — Other Ambulatory Visit: Payer: Self-pay | Admitting: Physician Assistant

## 2020-04-10 DIAGNOSIS — G4700 Insomnia, unspecified: Secondary | ICD-10-CM

## 2020-04-10 DIAGNOSIS — F331 Major depressive disorder, recurrent, moderate: Secondary | ICD-10-CM | POA: Diagnosis not present

## 2020-04-10 DIAGNOSIS — T887XXA Unspecified adverse effect of drug or medicament, initial encounter: Secondary | ICD-10-CM

## 2020-04-10 DIAGNOSIS — F429 Obsessive-compulsive disorder, unspecified: Secondary | ICD-10-CM

## 2020-04-10 DIAGNOSIS — R69 Illness, unspecified: Secondary | ICD-10-CM | POA: Diagnosis not present

## 2020-04-10 DIAGNOSIS — F411 Generalized anxiety disorder: Secondary | ICD-10-CM

## 2020-04-10 MED ORDER — ESCITALOPRAM OXALATE 10 MG PO TABS: 10.0000 mg | ORAL_TABLET | Freq: Every day | ORAL | 1 refills | Status: DC

## 2020-04-10 MED FILL — ESCITALOPRAM 10 MG TABLET: 10 | 30 days supply | Qty: 30 | Fill #0

## 2020-04-10 NOTE — Progress Notes (Signed)
[facility]  Patient ID: Judy Foster,  MRN: [id number]  PCP: Jearld Fenton, NP  Date of Evaluation: 04/10/2020 Time spent:40 minutes  Chief Complaint:  Chief Complaint    Anxiety; Depression; Insomnia      HISTORY/CURRENT STATUS: HPI For routine med check.  Viibryd is causing h/a 80% of the time and loose, frequent stools.  Feels apathetic.  No sex drive.  Knows several people who are on Lexapro and they are doing well with that.  Wonders if she could take that.  Working out 2-3 times per week, for about 3 months.  Still has to push herself to do things, feels like she is back to square 1 as far as giving herself pep talks to get up and go to work every day.  She is not missing any though.  Sleeps okay.  Not crying easily.  Not angry or as irritable.  No suicidal or homicidal thoughts.  Still feels anxious quite a bit, more generalized.  She is only taking Xanax at night but probably needs it more often.  Patient denies increased energy with decreased need for sleep, no increased talkativeness, no racing thoughts, no impulsivity or risky behaviors, no increased spending, no increased libido, no grandiosity, no increased irritability or anger,no paranoia,  and no hallucinations.  Denies dizziness, syncope, seizures, numbness, tingling, tremor, tics, unsteady gait, slurred speech, confusion. Denies muscle or joint pain, stiffness, or dystonia.  Individual Medical History/ Review of Systems: Changes? :No    Past medications for mental health diagnoses include: Prozac caused drowiness, Wellbutrin, Zoloft seemed to work for [employer], trazodone, Melatonin, Viibryd caused  Allergies: Penicillins  Current Medications:  Current Outpatient Medications:  .  ALPRAZolam (XANAX) 0.5 MG tablet, Take 1 tablet (0.5 mg total) by mouth 2 (two) times daily as needed for anxiety., Disp: 120 tablet, Rfl: 2 .  escitalopram (LEXAPRO) 10 MG tablet, Take 1 tablet (10 mg total) by mouth  daily., Disp: 30 tablet, Rfl: 1 .  Melatonin 3-10 MG TABS, Take 1 mg by mouth., Disp: , Rfl:  .  Multiple Vitamin (MULTIVITAMIN) tablet, Take 1 tablet by mouth daily., Disp: , Rfl:  .  Vilazodone HCl (VIIBRYD) 20 MG TABS, Take 1 tablet (20 mg total) by mouth in the morning., Disp: 30 tablet, Rfl: 1 .  spironolactone (ALDACTONE) 100 MG tablet, , Disp: , Rfl:  Medication Side Effects: headache and sexual dysfunction  Family Medical/ Social History: Changes? No  MENTAL HEALTH EXAM:  There were no vitals taken for this visit.There is no height or weight on file to calculate BMI.  General Appearance: Casual, Neat and Well Groomed  Eye Contact:  Good  Speech:  Clear and Coherent and Normal Rate  Volume:  Normal  Mood:  Euthymic  Affect:  Appropriate  Thought Process:  Goal Directed and Descriptions of Associations: Intact  Orientation:  Full (Time, Place, and Person)  Thought Content: Logical   Suicidal Thoughts:  No  Homicidal Thoughts:  No  Memory:  WNL  Judgement:  Good  Insight:  Good  Psychomotor Activity:  Normal  Concentration:  Concentration: Good and Attention Span: Good  Recall:  Good  Fund of Knowledge: Good  Language: Good  Assets:  Desire for Improvement  ADL's:  Intact  Cognition: WNL  Prognosis:  Good   03/25/2020  CBC was normal CMP glucose 110 otherwise normal Lipid panel total cholesterol 273, triglycerides 114, LDL 188, HDL 44 TSH 2.63 Hemoglobin A1c 5.8  DIAGNOSES:  ICD-10-CM   1. Major depressive disorder, recurrent episode, moderate (HCC)  F33.1   2. Generalized anxiety disorder  F41.1   3. Insomnia, unspecified type  G47.00   4. Obsessive-compulsive disorder, unspecified type  F42.9   5. Side effect of drug  T88.7XXA     Receiving Psychotherapy: No    RECOMMENDATIONS:  PDMP was reviewed. I provided 40 minutes of face-to-face time during this encounter, including review of past medications used for mental health reasons and discussing our plan  moving forward.  Normally after a failure of 2 or more SSRIs, changing to an SNRI or another antidepressant class is the norm.  However she would like to try the Lexapro because she has heard good things about it, and it is not wrong to try it.  She did respond to Zoloft or a long while, but it pooped out.  If the Lexapro is not effective I would recommend changing to Pristiq or Cymbalta.  She understands. I am glad to hear that she is exercising more and eating a little better.  Keep up the good work! Recent lab results were reviewed with her.  Her PCP is following her for those labs. Wean off the Viibryd by taking 10 mg a day for 3 days, 5 mg a day for 3 days and then stop. Start Lexapro 10 mg, 1 p.o. daily at the same time she is weaning off the [place].  May need to consider increasing the dose in 3 to 4 weeks, before next appointment anyway.  She knows that this can take 4 to 6 weeks to reach maximum efficacy but it would be appropriate to increase sooner due to the fact that she has been on Viibryd and has used other SSRIs. Continue Xanax 0.5 mg, 1 p.o. twice daily as needed. Continue melatonin OTC as needed. Recommend counseling. Return in 4-6 weeks.  Donnal Moat, PA-C

## 2020-04-10 NOTE — Patient Instructions (Signed)
Wean off Viibryd by taking 10 mg a day for 3 days, then 5 mg a day for 3 days and then stop. At the same time start the Lexapro.

## 2020-04-17 MED FILL — ALPRAZolam 0.5 MG TABS: 0.5 | 30 days supply | Qty: 60 | Fill #1

## 2020-05-01 ENCOUNTER — Other Ambulatory Visit: Payer: Self-pay | Admitting: *Deleted

## 2020-05-01 DIAGNOSIS — R1030 Lower abdominal pain, unspecified: Secondary | ICD-10-CM

## 2020-05-07 ENCOUNTER — Other Ambulatory Visit: Payer: Self-pay

## 2020-05-07 ENCOUNTER — Ambulatory Visit
Admission: RE | Admit: 2020-05-07 | Discharge: 2020-05-07 | Disposition: A | Discharge status: Home / Self Care | Payer: 59 | Source: Ambulatory Visit | Attending: Advanced Practice Midwife | Admitting: Advanced Practice Midwife

## 2020-05-07 DIAGNOSIS — R1030 Lower abdominal pain, unspecified: Secondary | ICD-10-CM | POA: Diagnosis present

## 2020-05-11 ENCOUNTER — Ambulatory Visit: Payer: 59 | Admitting: Internal Medicine

## 2020-05-12 ENCOUNTER — Telehealth: Payer: Self-pay | Admitting: Advanced Practice Midwife

## 2020-05-12 NOTE — Telephone Encounter (Signed)
Per patient request, called at home to discuss ultrasound results. Explained that if patient desires investigation into 9 and 18 mm masses identified on pelvic ultrasound she will need to bridge to MD care as those procedures are performed by MDs. Reassurance provided. Patient denies questions or concerns at end of call.  Mallie Snooks, MSN, CNM Certified Nurse Midwife, [facility] for [employer], [place] 05/12/20 9:57 AM

## 2020-06-01 ENCOUNTER — Ambulatory Visit (INDEPENDENT_AMBULATORY_CARE_PROVIDER_SITE_OTHER): Payer: 59 | Admitting: Family Medicine

## 2020-06-01 ENCOUNTER — Encounter: Payer: Self-pay | Admitting: Family Medicine

## 2020-06-01 ENCOUNTER — Other Ambulatory Visit: Payer: Self-pay

## 2020-06-01 VITALS — BP 122/85 | HR 99 | Wt 218.0 lb

## 2020-06-01 DIAGNOSIS — N9489 Other specified conditions associated with female genital organs and menstrual cycle: Secondary | ICD-10-CM | POA: Diagnosis not present

## 2020-06-01 NOTE — Assessment & Plan Note (Signed)
Found on u/s. No cycle issues. Given description, suspect polyps. Alternatives of office EMB and sonohysterogram and hysteroscopy with myosure, she opted for office EMB and sonohyst. Will schedule. RLQ pain, unlikely to be ovarian, mittleschmertz or endoemtriosis, based on description of non-cyclical.

## 2020-06-01 NOTE — Progress Notes (Signed)
   Subjective:    Patient ID: Judy Foster is a 45 y.o. female presenting with Follow-up ([place] results)  on 06/01/2020  HPI: Patient seen for yearly with CNM. Then complained of lower abdominal pain and pelvic sonogram revealed 2 masses in the endometrium of 9 mm anteriorly and 18 mm posteriorly. They were hyperechoic and had vascularity. Reports pain x 3 months, intermittent, right sided, occurs 2-3x/wk and does not last very long.  Review of Systems  Constitutional: Negative for chills and fever.  Respiratory: Negative for shortness of breath.   Cardiovascular: Negative for chest pain.  Gastrointestinal: Positive for abdominal pain. Negative for nausea and vomiting.  Genitourinary: Negative for dysuria.  Skin: Negative for rash.      Objective:    BP 122/85   Pulse 99   Wt 218 lb (98.9 kg)   LMP 05/18/2020 (Approximate)   BMI 32.19 kg/m  Physical Exam Constitutional:      General: She is not in acute distress.    Appearance: She is well-developed.  HENT:     Head: Normocephalic and atraumatic.  Eyes:     General: No scleral icterus. Cardiovascular:     Rate and Rhythm: Normal rate.  Pulmonary:     Effort: Pulmonary effort is normal.  Abdominal:     Palpations: Abdomen is soft.  Musculoskeletal:     Cervical back: Neck supple.  Skin:    General: Skin is warm and dry.  Neurological:     Mental Status: She is alert and oriented to person, place, and time.         Assessment & Plan:   Problem List Items Addressed This Visit      Unprioritized   Endometrial mass - Primary    Found on u/s. No cycle issues. Given description, suspect polyps. Alternatives of office EMB and sonohysterogram and hysteroscopy with myosure, she opted for office EMB and sonohyst. Will schedule. RLQ pain, unlikely to be ovarian, mittleschmertz or endoemtriosis, based on description of non-cyclical.      Relevant Orders   [place] Sonohysterogram      Return in about 4 weeks (around  06/29/2020) for needs U/S, endometrial biopsy.  Judy Foster 06/01/2020 3:17 PM

## 2020-06-01 NOTE — Patient Instructions (Signed)
Endometrial Biopsy  An endometrial biopsy is a procedure to remove tissue samples from the endometrium, which is the lining of the uterus. The tissue that is removed can then be checked under a microscope for disease. This procedure is used to diagnose conditions such as endometrial cancer, endometrial tuberculosis, polyps, or other inflammatory conditions. This procedure may also be used to investigate uterine bleeding to determine where you are in your menstrual cycle or how your hormone levels are affecting the lining of the uterus. Tell a health care provider about:  Any allergies you have.  All medicines you are taking, including vitamins, herbs, eye drops, creams, and over-the-counter medicines.  Any problems you or family members have had with anesthetic medicines.  Any blood disorders you have.  Any surgeries you have had.  Any medical conditions you have.  Whether you are pregnant or may be pregnant. What are the risks? Generally, this is a safe procedure. However, problems may occur, including:  Bleeding.  Pelvic infection.  Puncture of the wall of the uterus with the biopsy device (rare).  Allergic reactions to medicines. What happens before the procedure?  Keep a record of your menstrual cycles as told by your health care provider. You may need to schedule your procedure for a specific time in your cycle.  You may want to bring a sanitary pad to wear after the procedure.  Plan to have someone take you home from the hospital or clinic.  Ask your health care provider about: ? Changing or stopping your regular medicines. This is especially important if you are taking diabetes medicines, arthritis medicines, or blood thinners. ? Taking medicines such as aspirin and ibuprofen. These medicines can thin your blood. Do not take these medicines unless your health care provider tells you to take them. ? Taking over-the-counter medicines, vitamins, herbs, and  supplements. What happens during the procedure?  You will lie on an exam table with your feet and legs supported as in a pelvic exam.  Your health care provider will insert an instrument (speculum) into your vagina to see your cervix.  Your cervix will be cleansed with an antiseptic solution.  A medicine (local anesthetic) will be used to numb the cervix.  A forceps instrument (tenaculum) will be used to hold your cervix steady for the biopsy.  A thin, rod-like instrument (uterine sound) will be inserted through your cervix to determine the length of your uterus and the location where the biopsy sample will be removed.  A thin, flexible tube (catheter) will be inserted through your cervix and into the uterus. The catheter will be used to collect the biopsy sample from your endometrial tissue.  The catheter and speculum will then be removed, and the tissue sample will be sent to a lab for examination. The procedure may vary among health care providers and hospitals. What can I expect after procedure?  You will rest in a recovery area until you are ready to go home.  You may have mild cramping and a small amount of vaginal bleeding. This is normal.  You may have a small amount of vaginal bleeding for a few days. This is normal.  It is up to you to get the results of your procedure. Ask your health care provider, or the department that is doing the procedure, when your results will be ready. Follow these instructions at home:  Take over-the-counter and prescription medicines only as told by your health care provider.  Do not douche, use tampons, or have   sexual intercourse until your health care provider approves.  Return to your normal activities as told by your health care provider. Ask your health care provider what activities are safe for you.  Follow instructions from your health care provider about any activity restrictions, such as restrictions on strenuous exercise or heavy  lifting.  Keep all follow-up visits. This is important. Contact a health care provider:  You have heavy bleeding, or bleed for longer than 2 days after the procedure.  You have bad smelling discharge from your vagina.  You have a fever or chills.  You have a burning sensation when urinating or you have difficulty urinating.  You have severe pain in your lower abdomen. Get help right away if you:  You have severe cramps in your stomach or back.  You pass large blood clots.  Your bleeding increases.  You become weak or light-headed, or you faint or lose consciousness. Summary  An endometrial biopsy is a procedure to remove tissue samples is taken from the endometrium, which is the lining of the uterus.  The tissue sample that is removed will be checked under a microscope for disease.  This procedure is used to diagnose conditions such as endometrial cancer, endometrial tuberculosis, polyps, or other inflammatory conditions.  After the procedure, it is common to have mild cramping and a small amount of vaginal bleeding for a few days.  Do not douche, use tampons, or have sexual intercourse until your health care provider approves. Ask your health care provider which activities are safe for you. This information is not intended to replace advice given to you by your health care provider. Make sure you discuss any questions you have with your health care provider. Document Revised: 09/02/2019 Document Reviewed: 09/02/2019 Elsevier Patient Education  2021 Elsevier Inc.  

## 2020-06-03 ENCOUNTER — Ambulatory Visit: Payer: 59 | Admitting: Advanced Practice Midwife

## 2020-06-04 ENCOUNTER — Ambulatory Visit: Payer: 59 | Admitting: Physician Assistant

## 2020-06-08 ENCOUNTER — Other Ambulatory Visit (HOSPITAL_COMMUNITY): Payer: Self-pay

## 2020-06-08 ENCOUNTER — Ambulatory Visit (INDEPENDENT_AMBULATORY_CARE_PROVIDER_SITE_OTHER): Payer: 59 | Admitting: Physician Assistant

## 2020-06-08 ENCOUNTER — Other Ambulatory Visit: Payer: Self-pay

## 2020-06-08 ENCOUNTER — Encounter: Payer: Self-pay | Admitting: Physician Assistant

## 2020-06-08 DIAGNOSIS — F411 Generalized anxiety disorder: Secondary | ICD-10-CM | POA: Diagnosis not present

## 2020-06-08 DIAGNOSIS — F3341 Major depressive disorder, recurrent, in partial remission: Secondary | ICD-10-CM

## 2020-06-08 MED ORDER — ALPRAZOLAM 0.5 MG PO TABS
0.5000 mg | ORAL_TABLET | Freq: Two times a day (BID) | ORAL | 0 refills | Status: DC | PRN
  Filled 2020-06-08: qty 60, 30d supply, fill #0

## 2020-06-08 MED ORDER — ESCITALOPRAM OXALATE 10 MG PO TABS
10.0000 mg | ORAL_TABLET | Freq: Every day | ORAL | 1 refills | Status: DC
  Filled 2020-06-08: qty 30, 30d supply, fill #0
  Filled 2020-07-09: qty 30, 30d supply, fill #1
  Filled 2020-08-12: qty 30, 30d supply, fill #2
  Filled 2020-09-10: qty 30, 30d supply, fill #3

## 2020-06-08 NOTE — Progress Notes (Signed)
[facility]  Patient ID: Judy Foster,  MRN: [id number]  PCP: Jearld Fenton, NP  Date of Evaluation: 06/08/2020 Time spent:20 minutes  Chief Complaint:  Chief Complaint    Anxiety; Depression; Follow-up      HISTORY/CURRENT STATUS: HPI for routine med check.  2 months ago we weaned her off the [place] and restarted Lexapro.  She has been doing quite a bit better.  Thinks she is more like herself and as happy as "it is going to get."  She has been enjoying things more.  She and her husband went on cruise and enjoyed that.  Work is going well.  Energy and motivation are good for her.  She is not isolating.  Does not cry easily.  No suicidal or homicidal thoughts.  Usually takes Xanax, only before bed.  Even in she will split the pill in half and take that.  It does help her to relax and go to sleep.  There are occasions when she needs it during the day.  She is not really having panic attacks but more of a sense of unease.  Denies dizziness, syncope, seizures, numbness, tingling, tremor, tics, unsteady gait, slurred speech, confusion. Denies muscle or joint pain, stiffness, or dystonia.  Individual Medical History/ Review of Systems: Changes? :No   Past medications for mental health diagnoses include: Prozac caused drowiness, Wellbutrin, Zoloft seemed to work for [employer], trazodone, Melatonin, Viibryd caused headaches.  Allergies: Penicillins  Current Medications:  Current Outpatient Medications:  .  ALPRAZolam (XANAX) 0.5 MG tablet, TAKE 1 TABLET BY MOUTH 2 TIMES DAILY AS NEEDED FOR ANXIETY., Disp: 120 tablet, Rfl: 2 .  ALPRAZolam (XANAX) 0.5 MG tablet, Take 1 tablet (0.5 mg total) by mouth 2 (two) times daily as needed for anxiety., Disp: 180 tablet, Rfl: 0 .  Melatonin 3-10 MG TABS, Take 1 mg by mouth., Disp: , Rfl:  .  Multiple Vitamin (MULTIVITAMIN) tablet, Take 1 tablet by mouth daily., Disp: , Rfl:  .  escitalopram (LEXAPRO) 10 MG tablet, Take 1 tablet (10  mg total) by mouth daily for 180 doses., Disp: 90 tablet, Rfl: 1 Medication Side Effects: none  Family Medical/ Social History: Changes? No  MENTAL HEALTH EXAM:  Last menstrual period 05/18/2020.There is no height or weight on file to calculate BMI.  General Appearance: Casual, Neat and Well Groomed  Eye Contact:  Good  Speech:  Clear and Coherent and Normal Rate  Volume:  Normal  Mood:  Euthymic  Affect:  Appropriate  Thought Process:  Goal Directed  Orientation:  Full (Time, Place, and Person)  Thought Content: Logical   Suicidal Thoughts:  No  Homicidal Thoughts:  No  Memory:  WNL  Judgement:  Good  Insight:  Good  Psychomotor Activity:  Normal  Concentration:  Concentration: Good  Recall:  Good  Fund of Knowledge: Good  Language: Good  Assets:  Desire for Improvement  ADL's:  Intact  Cognition: WNL  Prognosis:  Good    DIAGNOSES:    ICD-10-CM   1. Depression, major, recurrent, in partial remission ([place])  F33.41   2. Generalized anxiety disorder  F41.1     Receiving Psychotherapy: Yes    RECOMMENDATIONS:  PDMP was reviewed. I provided 20 minutes of face-to-face time during this encounter, including time spent before and after the visit in chart review and documentation. I am glad to see her doing so well!  No medication changes are needed. Continue Xanax 0.5 mg, 1 p.o. twice daily as  needed. Continue Lexapro 10 mg, 1 p.o. daily. Return in 4 months.  Donnal Moat, PA-C

## 2020-07-10 ENCOUNTER — Other Ambulatory Visit (HOSPITAL_COMMUNITY): Payer: Self-pay

## 2020-07-16 ENCOUNTER — Other Ambulatory Visit: Payer: 59 | Admitting: Family Medicine

## 2020-08-12 ENCOUNTER — Other Ambulatory Visit (HOSPITAL_COMMUNITY): Payer: Self-pay

## 2020-08-14 ENCOUNTER — Telehealth (HOSPITAL_BASED_OUTPATIENT_CLINIC_OR_DEPARTMENT_OTHER): Payer: Self-pay

## 2020-08-14 ENCOUNTER — Encounter (HOSPITAL_BASED_OUTPATIENT_CLINIC_OR_DEPARTMENT_OTHER): Payer: Self-pay

## 2020-08-14 NOTE — Telephone Encounter (Signed)
Judy Foster is a 45 y.o. female  was called and contacted re: New pt Pre appt call to collect history information. -Allergy -Medication -Confirm pharmacy -OB history   Pt was available.Chart was updated. Pt was notified to arrive 15 min early and we will need a urine sample when she arrives. Pt verbalized understanding.

## 2020-08-19 ENCOUNTER — Encounter (HOSPITAL_BASED_OUTPATIENT_CLINIC_OR_DEPARTMENT_OTHER): Payer: Self-pay | Admitting: Obstetrics & Gynecology

## 2020-08-19 ENCOUNTER — Ambulatory Visit (INDEPENDENT_AMBULATORY_CARE_PROVIDER_SITE_OTHER): Payer: 59 | Admitting: Obstetrics & Gynecology

## 2020-08-19 ENCOUNTER — Other Ambulatory Visit: Payer: Self-pay

## 2020-08-19 VITALS — BP 136/88 | Ht 69.0 in | Wt 220.0 lb

## 2020-08-19 DIAGNOSIS — R935 Abnormal findings on diagnostic imaging of other abdominal regions, including retroperitoneum: Secondary | ICD-10-CM | POA: Diagnosis not present

## 2020-08-19 DIAGNOSIS — N9489 Other specified conditions associated with female genital organs and menstrual cycle: Secondary | ICD-10-CM | POA: Diagnosis not present

## 2020-08-22 ENCOUNTER — Encounter (HOSPITAL_BASED_OUTPATIENT_CLINIC_OR_DEPARTMENT_OTHER): Payer: Self-pay | Admitting: Obstetrics & Gynecology

## 2020-08-22 NOTE — Progress Notes (Signed)
45 y.o. G59P0012 Married Caucasian female here for second opinion regarding recent ultrasound findings obtained 05/07/2020.  Ultrasound showed uterus to measure 10.2 x 5.1 x 5.9cm with endometrium 11mm with two vascular lesions measuring 6mm and 34mm.  Ovaries were normal.  No free fluid in cul de sac noted.  She has been having a very specific pain in the lower right pelvis.  She is unsure if this is related as am I but the pain is what started the evaluation.    Did see Dr. Kennon Rounds.  Her note was reviewed.    Additional evaluation with endosee in office, sonohysterogram or hysteroscopy discussed.  Risks and benefits of each discussed.  Pt desires the most definitive option so will proceed with hysteroscopy as the vascular lesions do need additional evaluation.  Procedure discussed with patient.  Recovery and pain management discussed.  Risks discussed including but not limited to bleeding, rare risk of transfusion, infection, 1% risk of uterine perforation with risks of fluid deficit causing cardiac arrythmia, cerebral swelling and/or need to stop procedure early.  Fluid emboli and rare risk of death discussed.  DVT/PE, rare risk of risk of bowel/bladder/ureteral/vascular injury.  Patient aware if pathology abnormal she may need additional treatment.  All questions answered.    Last MMG:  03/24/2020 Last Pap smear:  03/25/2020 ASCUS pap with neg HR HPV.  Prior pap smears normal.    Patient's last menstrual period was 08/13/2020 (exact date).            reports that she has never smoked. She has never used smokeless tobacco. She reports that she does not drink alcohol and does not use drugs.  Past Medical History:  Diagnosis Date   Anal fissure    Anxiety    Asthma, exercise induced    as a teen   Cardiac arrhythmia    Depression    Dysmenorrhea    past   HLD (hyperlipidemia)    Hyperlipidemia    Mononucleosis 1993   Shingles     Past Surgical History:  Procedure Laterality Date   CRANIECTOMY  SUBOCCIPITAL W/ CERVICAL LAMINECTOMY / CHIARI  2006    Current Outpatient Medications  Medication Sig Dispense Refill   ALPRAZolam (XANAX) 0.5 MG tablet Take 1 tablet (0.5 mg total) by mouth 2 (two) times daily as needed for anxiety. 180 tablet 0   escitalopram (LEXAPRO) 10 MG tablet Take 1 tablet (10 mg total) by mouth daily. 90 tablet 1   Melatonin 3-10 MG TABS Take 1 mg by mouth.     Multiple Vitamin (MULTIVITAMIN) tablet Take 1 tablet by mouth daily.     Probiotic Product (PROBIOTIC DAILY PO) Take by mouth.     No current facility-administered medications for this visit.    Family History  Problem Relation Age of Onset   Colon cancer Maternal Grandmother    Thyroid disease Maternal Grandmother    Stroke Maternal Grandmother    Kidney disease Maternal Grandmother    Leukemia Maternal Grandfather    Heart disease Maternal Grandfather    Hypertension Mother    Thyroid disease Mother        hypothyroid   Colon polyps Mother    Diabetes Mother    Breast cancer Mother 5    Review of Systems  Constitutional: Negative.   Gastrointestinal:  Positive for abdominal pain.  Genitourinary:  Negative for menstrual problem and vaginal discharge.   Exam:   BP 136/88   Ht 5\' 9"  (1.753 m)  Wt 220 lb (99.8 kg)   LMP 08/13/2020 (Exact Date)   BMI 32.49 kg/m   Height: 5\' 9"  (175.3 cm)  General appearance: alert, cooperative and appears stated age Head: Normocephalic, without obvious abnormality, atraumatic Lungs: clear to auscultation bilaterally Heart: regular rate and rhythm  Assessment/Plan: 1. Abnormal ultrasound of endometrium - will proceed with scheduling hysteroscopy with possible resection of vascular lesions, D&C.  Risks, benefits and alternatives discussed - pt understands pain may be persistent after procedure and additional evaluation may be needed at that time.  Pt comfortable with plan and understands limitations.

## 2020-08-27 ENCOUNTER — Encounter (HOSPITAL_BASED_OUTPATIENT_CLINIC_OR_DEPARTMENT_OTHER): Payer: Self-pay | Admitting: *Deleted

## 2020-08-27 ENCOUNTER — Telehealth: Payer: Self-pay | Admitting: *Deleted

## 2020-08-27 NOTE — Telephone Encounter (Signed)
2nd call to patient. Left message that surgery is scheduled for Tuesday, 10-06-20 at 1200.  Left message attempting to schedule before out of office- patient can call back if this date does not work. Letter mailed and visible on My Chart.  Encounter closed.

## 2020-08-27 NOTE — Telephone Encounter (Signed)
Call to patient regarding surgery date options. Left message to call back to [phone].

## 2020-09-10 ENCOUNTER — Other Ambulatory Visit (HOSPITAL_COMMUNITY): Payer: Self-pay

## 2020-09-10 ENCOUNTER — Other Ambulatory Visit: Payer: Self-pay

## 2020-09-10 ENCOUNTER — Encounter (HOSPITAL_BASED_OUTPATIENT_CLINIC_OR_DEPARTMENT_OTHER): Payer: Self-pay | Admitting: Obstetrics & Gynecology

## 2020-09-10 ENCOUNTER — Telehealth (INDEPENDENT_AMBULATORY_CARE_PROVIDER_SITE_OTHER): Payer: 59 | Admitting: Obstetrics & Gynecology

## 2020-09-10 DIAGNOSIS — R935 Abnormal findings on diagnostic imaging of other abdominal regions, including retroperitoneum: Secondary | ICD-10-CM

## 2020-09-10 NOTE — Progress Notes (Signed)
Virtual Visit via Video Note  I connected with Judy Foster on 09/10/20 at  3:15 PM EDT by a video enabled telemedicine application and verified that I am speaking with the correct person using two identifiers.  Location: Patient: Home Provider: office   I discussed the limitations of evaluation and management by telemedicine and the availability of in person appointments. The patient expressed understanding and agreed to proceed.  History of Present Illness: 45 yo G16P0012 Married Caucasian female who is having a hysteroscopy with removal of vascular lesions.  This is being done after ultrasound obtained 05/07/2020 showed uterus measuring 10.2 x 5.1 x 5.9cm but with endometrium 89mm with two vascular lesions measuring 21mm and 54mm.  Pt was seen 08/19/2020.  Options for treatment reviewed.  Risks and benefits reviewed.  Pt felt comfortable with recommended procedure and is scheduled for 10/13/2020.  Virtual appt was scheduled for any additional discussion.  Pt reports she is very comfortable with plan of care and really doesn't have any additional questions.  I did briefly review the procedure and plan as well as reviewed day of surgery and post op pain management.  Full risks and benefits discussion has already occurred.  Pap 03/25/2020 ASCUS with neg HR HPV   Observations/Objective: WNWD WF NAD  Assessment and Plan: Abnormal endometrium with two vascular masses, most likely polyps  Follow Up Instructions: Hysteroscopy with resection of vascular masses, D&C planned   I discussed the assessment and treatment plan with the patient. The patient was provided an opportunity to ask questions and all were answered. The patient agreed with the plan and demonstrated an understanding of the instructions.    Felipa Emory, MD

## 2020-10-05 ENCOUNTER — Other Ambulatory Visit (HOSPITAL_COMMUNITY): Payer: Self-pay

## 2020-10-05 ENCOUNTER — Ambulatory Visit (INDEPENDENT_AMBULATORY_CARE_PROVIDER_SITE_OTHER): Payer: 59 | Admitting: Physician Assistant

## 2020-10-05 ENCOUNTER — Encounter: Payer: Self-pay | Admitting: Physician Assistant

## 2020-10-05 ENCOUNTER — Other Ambulatory Visit: Payer: Self-pay

## 2020-10-05 DIAGNOSIS — G4700 Insomnia, unspecified: Secondary | ICD-10-CM

## 2020-10-05 DIAGNOSIS — F3341 Major depressive disorder, recurrent, in partial remission: Secondary | ICD-10-CM | POA: Diagnosis not present

## 2020-10-05 DIAGNOSIS — F429 Obsessive-compulsive disorder, unspecified: Secondary | ICD-10-CM

## 2020-10-05 DIAGNOSIS — F411 Generalized anxiety disorder: Secondary | ICD-10-CM | POA: Diagnosis not present

## 2020-10-05 MED ORDER — ESCITALOPRAM OXALATE 10 MG PO TABS
10.0000 mg | ORAL_TABLET | Freq: Every day | ORAL | 1 refills | Status: DC
  Filled 2020-10-05 – 2020-10-14 (×2): qty 30, 30d supply, fill #0
  Filled 2020-11-16: qty 30, 30d supply, fill #1
  Filled 2020-12-14: qty 30, 30d supply, fill #2
  Filled 2021-01-19: qty 30, 30d supply, fill #3

## 2020-10-05 NOTE — Progress Notes (Signed)
**Note Judy-Identified via Obfuscation** [facility]  Patient ID: DEVIDA Foster,  MRN: [id number]  PCP: Pcp, No  Date of Evaluation: 10/05/2020 Time spent:20 minutes  Chief Complaint:  Chief Complaint   Anxiety; Depression; Insomnia; Follow-up      HISTORY/CURRENT STATUS: HPI for routine med check.  Has been on Lexapro for around 6 months.  States it is working well for the most part.  She is a little overwhelmed with everything that is going on (see social history) but feels like the medication is doing its job.  Not having sexual side effects that are too bad.  States still not enough that her husband would be pleased but it is not like she has no desire at all.  She does get really hot at night but not sweating a whole lot.  No other side effects are reported.  Patient denies loss of interest in usual activities and is able to enjoy things.  Denies decreased energy or motivation.  Appetite has not changed.  No extreme sadness, tearfulness, or feelings of hopelessness.  She does need to take the Xanax occasionally.  When she does take it is more for sleep than anything, or at least to help her relax before bed so she can rest well.  She does take melatonin as well and its usually enough.  Not having panic attacks.  OCD symptoms are controlled.  Not having obsessive thoughts like she was.  Denies any changes in concentration, making decisions or remembering things.  Denies suicidal or homicidal thoughts.  Denies dizziness, syncope, seizures, numbness, tingling, tremor, tics, unsteady gait, slurred speech, confusion. Denies muscle or joint pain, stiffness, or dystonia.  Individual Medical History/ Review of Systems: Changes? :Yes   she will have a D and C next week, she is having a lot of abdominal pain and a couple of lesions have been found in her uterus so will have a biopsy.  Past medications for mental health diagnoses include: Prozac caused drowiness, Wellbutrin, Zoloft seemed to work for [employer], trazodone,  Melatonin, Viibryd caused headaches, Lexapro, Xanax   Allergies: Penicillins  Current Medications:  Current Outpatient Medications:    ALPRAZolam (XANAX) 0.5 MG tablet, Take 1 tablet (0.5 mg total) by mouth 2 (two) times daily as needed for anxiety., Disp: 180 tablet, Rfl: 0   magnesium oxide (MAG-OX) 400 MG tablet, Take 400 mg by mouth daily., Disp: , Rfl:    Melatonin 3-10 MG TABS, Take 1 mg by mouth., Disp: , Rfl:    Multiple Vitamin (MULTIVITAMIN) tablet, Take 1 tablet by mouth daily., Disp: , Rfl:    Probiotic Product (PROBIOTIC DAILY PO), Take by mouth., Disp: , Rfl:    escitalopram (LEXAPRO) 10 MG tablet, Take 1 tablet (10 mg total) by mouth daily., Disp: 90 tablet, Rfl: 1 Medication Side Effects: none  Family Medical/ Social History: Changes? Working 20 hours a week starting next week, and will start back to school tomorrow.  She is in a International aid/development worker school, her last year.  MENTAL HEALTH EXAM:  There were no vitals taken for this visit.There is no height or weight on file to calculate BMI.  General Appearance: Casual, Neat and Well Groomed  Eye Contact:  Good  Speech:  Clear and Coherent and Normal Rate  Volume:  Normal  Mood:  Euthymic  Affect:  Appropriate  Thought Process:  Goal Directed and Descriptions of Associations: Circumstantial  Orientation:  Full (Time, Place, and Person)  Thought Content: Logical   Suicidal Thoughts:  No  Homicidal Thoughts:  No  Memory:  WNL  Judgement:  Good  Insight:  Good  Psychomotor Activity:  Normal  Concentration:  Concentration: Good and Attention Span: Good  Recall:  Good  Fund of Knowledge: Good  Language: Good  Assets:  Desire for Improvement  ADL's:  Intact  Cognition: WNL  Prognosis:  Good    DIAGNOSES:    ICD-10-CM   1. Depression, major, recurrent, in partial remission ([place])  F33.41     2. Generalized anxiety disorder  F41.1     3. Insomnia, unspecified type  G47.00     4. Obsessive-compulsive  disorder, unspecified type  F42.9        Receiving Psychotherapy: No    RECOMMENDATIONS:  PDMP was reviewed.  Last Xanax filled 06/08/2020. I provided 20 minutes of face to face time during this encounter, including time spent before and after the visit in records review, medical decision making, and charting.  I am glad to see her doing as well if she is, if the sense of being overwhelmed does not improve, is needing Xanax more often for anxiety or sleep, or if depression symptoms start to creep in, call and I will increase Lexapro to 15 mg.  For right now no changes are needed though. Continue Xanax 0.5 mg, 1 p.o. twice daily as needed. Continue Lexapro 10 mg, 1 p.o. daily. Return in 4 months.  Donnal Moat, PA-C

## 2020-10-09 ENCOUNTER — Other Ambulatory Visit: Payer: Self-pay

## 2020-10-09 ENCOUNTER — Encounter (HOSPITAL_COMMUNITY): Payer: Self-pay | Admitting: Obstetrics & Gynecology

## 2020-10-09 NOTE — Progress Notes (Signed)
PCP - pt denies (currently in between providers)  Cardiologist - denies EKG - denies Chest x-ray - denies ECHO - denies Cardiac Cath - denies CPAP - denies  COVID TEST- n/a  Anesthesia review: n/a  -------------  SDW INSTRUCTIONS:  Your procedure is scheduled on Tuesday 8/23. Please report to [facility] Main Entrance "A" at [place].M., and check in at the Admitting office. Call this number if you have problems the morning of surgery: [phone]   Remember: Do not eat or drink after midnight the night before your surgery   Medications to take morning of surgery with a sip of water include: acetaminophen (TYLENOL) if needed ALPRAZolam Duanne Moron) if needed  escitalopram (LEXAPRO)    As of today, STOP taking any Aspirin (unless otherwise instructed by your surgeon), Aleve, Naproxen, Ibuprofen, Motrin, Advil, Goody's, BC's, all herbal medications, fish oil, and all vitamins.    The Morning of Surgery Do not wear jewelry, make-up or nail polish. Do not wear lotions, powders, or perfumes, or deodorant Do not shave 48 hours prior to surgery.   Do not bring valuables to the hospital. [facility] is not responsible for any belongings or valuables.  If you are a smoker, DO NOT Smoke 24 hours prior to surgery  If you wear a CPAP at night please bring your mask the morning of surgery   Remember that you must have someone to transport you home after your surgery, and remain with you for 24 hours if you are discharged the same day.  Please bring cases for contacts, glasses, hearing aids, dentures or bridgework because it cannot be worn into surgery.   Patients discharged the day of surgery will not be allowed to drive home.   Please shower the NIGHT BEFORE/MORNING OF SURGERY (use antibacterial soap like DIAL soap if possible). Wear comfortable clothes the morning of surgery. Oral Hygiene is also important to reduce your risk of infection.  Remember - BRUSH YOUR TEETH THE MORNING OF  SURGERY WITH YOUR REGULAR TOOTHPASTE  Patient denies shortness of breath, fever, cough and chest pain.

## 2020-10-12 ENCOUNTER — Telehealth: Payer: Self-pay

## 2020-10-12 NOTE — Telephone Encounter (Signed)
Called patient, no answer, surgery time and arrival time left on voicemail.

## 2020-10-13 ENCOUNTER — Other Ambulatory Visit (HOSPITAL_COMMUNITY): Payer: Self-pay

## 2020-10-13 ENCOUNTER — Ambulatory Visit (HOSPITAL_COMMUNITY): Payer: 59 | Admitting: Certified Registered Nurse Anesthetist

## 2020-10-13 ENCOUNTER — Ambulatory Visit (HOSPITAL_COMMUNITY)
Admission: RE | Admit: 2020-10-13 | Discharge: 2020-10-13 | Disposition: A | Discharge status: Home / Self Care | Payer: 59 | Attending: Obstetrics & Gynecology | Admitting: Obstetrics & Gynecology

## 2020-10-13 ENCOUNTER — Encounter (HOSPITAL_COMMUNITY): Payer: Self-pay | Admitting: Obstetrics & Gynecology

## 2020-10-13 ENCOUNTER — Other Ambulatory Visit: Payer: Self-pay

## 2020-10-13 ENCOUNTER — Encounter (HOSPITAL_COMMUNITY)
Admission: RE | Disposition: A | Discharge status: Home / Self Care | Payer: Self-pay | Source: Home / Self Care | Attending: Obstetrics & Gynecology

## 2020-10-13 DIAGNOSIS — N858 Other specified noninflammatory disorders of uterus: Secondary | ICD-10-CM | POA: Diagnosis present

## 2020-10-13 DIAGNOSIS — Z880 Allergy status to penicillin: Secondary | ICD-10-CM | POA: Diagnosis not present

## 2020-10-13 DIAGNOSIS — Z79899 Other long term (current) drug therapy: Secondary | ICD-10-CM | POA: Diagnosis not present

## 2020-10-13 DIAGNOSIS — N840 Polyp of corpus uteri: Secondary | ICD-10-CM | POA: Insufficient documentation

## 2020-10-13 HISTORY — PX: DILATATION & CURETTAGE/HYSTEROSCOPY WITH MYOSURE: SHX6511

## 2020-10-13 LAB — CBC
HCT: 37.4 % (ref 36.0–46.0)
Hemoglobin: 12.5 g/dL (ref 12.0–15.0)
MCH: 31.1 pg (ref 26.0–34.0)
MCHC: 33.4 g/dL (ref 30.0–36.0)
MCV: 93 fL (ref 80.0–100.0)
Platelets: 283 10*3/uL (ref 150–400)
RBC: 4.02 MIL/uL (ref 3.87–5.11)
RDW: 12.3 % (ref 11.5–15.5)
WBC: 6 10*3/uL (ref 4.0–10.5)
nRBC: 0 % (ref 0.0–0.2)

## 2020-10-13 LAB — ABO/RH: ABO/RH(D): A POS

## 2020-10-13 LAB — TYPE AND SCREEN
ABO/RH(D): A POS
Antibody Screen: NEGATIVE

## 2020-10-13 LAB — POCT PREGNANCY, URINE: Preg Test, Ur: NEGATIVE

## 2020-10-13 SURGERY — DILATATION & CURETTAGE/HYSTEROSCOPY WITH MYOSURE
Anesthesia: General | Site: Uterus

## 2020-10-13 MED ORDER — SODIUM CHLORIDE 0.9 % IR SOLN
Status: DC | PRN
  Administered 2020-10-13: 1000 mL
  Administered 2020-10-13: 3000 mL

## 2020-10-13 MED ORDER — ROCURONIUM BROMIDE 10 MG/ML (PF) SYRINGE
PREFILLED_SYRINGE | INTRAVENOUS | Status: AC
  Filled 2020-10-13: qty 10

## 2020-10-13 MED ORDER — MIDAZOLAM HCL 2 MG/2ML IJ SOLN
INTRAMUSCULAR | Status: DC | PRN
  Administered 2020-10-13: 2 mg via INTRAVENOUS

## 2020-10-13 MED ORDER — DEXAMETHASONE SODIUM PHOSPHATE 10 MG/ML IJ SOLN
INTRAMUSCULAR | Status: DC | PRN
  Administered 2020-10-13: 10 mg via INTRAVENOUS

## 2020-10-13 MED ORDER — FENTANYL CITRATE (PF) 250 MCG/5ML IJ SOLN
INTRAMUSCULAR | Status: DC | PRN
  Administered 2020-10-13 (×2): 25 ug via INTRAVENOUS

## 2020-10-13 MED ORDER — LIDOCAINE 2% (20 MG/ML) 5 ML SYRINGE
INTRAMUSCULAR | Status: AC
  Filled 2020-10-13: qty 5

## 2020-10-13 MED ORDER — LIDOCAINE-EPINEPHRINE 1 %-1:100000 IJ SOLN
INTRAMUSCULAR | Status: AC
  Filled 2020-10-13: qty 1

## 2020-10-13 MED ORDER — ORAL CARE MOUTH RINSE: 15.0000 mL | Freq: Once | OROMUCOSAL | Status: AC

## 2020-10-13 MED ORDER — PROPOFOL 10 MG/ML IV BOLUS
INTRAVENOUS | Status: DC | PRN
  Administered 2020-10-13: 100 mg via INTRAVENOUS
  Administered 2020-10-13: 200 mg via INTRAVENOUS

## 2020-10-13 MED ORDER — SILVER NITRATE-POT NITRATE 75-25 % EX MISC
CUTANEOUS | Status: AC
  Filled 2020-10-13: qty 10

## 2020-10-13 MED ORDER — SCOPOLAMINE 1 MG/3DAYS TD PT72
MEDICATED_PATCH | TRANSDERMAL | Status: AC
  Filled 2020-10-13: qty 1

## 2020-10-13 MED ORDER — ONDANSETRON HCL 4 MG/2ML IJ SOLN
INTRAMUSCULAR | Status: AC
  Filled 2020-10-13: qty 2

## 2020-10-13 MED ORDER — PHENYLEPHRINE 40 MCG/ML (10ML) SYRINGE FOR IV PUSH (FOR BLOOD PRESSURE SUPPORT)
PREFILLED_SYRINGE | INTRAVENOUS | Status: DC | PRN
  Administered 2020-10-13: 40 ug via INTRAVENOUS

## 2020-10-13 MED ORDER — LIDOCAINE 2% (20 MG/ML) 5 ML SYRINGE
INTRAMUSCULAR | Status: DC | PRN
  Administered 2020-10-13: 40 mg via INTRAVENOUS

## 2020-10-13 MED ORDER — LIDOCAINE-EPINEPHRINE 1 %-1:100000 IJ SOLN
INTRAMUSCULAR | Status: DC | PRN
  Administered 2020-10-13: 10 mL

## 2020-10-13 MED ORDER — LACTATED RINGERS IV SOLN: INTRAVENOUS | Status: DC

## 2020-10-13 MED ORDER — PROPOFOL 10 MG/ML IV BOLUS
INTRAVENOUS | Status: AC
  Filled 2020-10-13: qty 40

## 2020-10-13 MED ORDER — DEXAMETHASONE SODIUM PHOSPHATE 10 MG/ML IJ SOLN
INTRAMUSCULAR | Status: AC
  Filled 2020-10-13: qty 1

## 2020-10-13 MED ORDER — FENTANYL CITRATE (PF) 250 MCG/5ML IJ SOLN
INTRAMUSCULAR | Status: AC
  Filled 2020-10-13: qty 5

## 2020-10-13 MED ORDER — MIDAZOLAM HCL 2 MG/2ML IJ SOLN
INTRAMUSCULAR | Status: AC
  Filled 2020-10-13: qty 2

## 2020-10-13 MED ORDER — LACTATED RINGERS IV SOLN: INTRAVENOUS | Status: DC | PRN

## 2020-10-13 MED ORDER — HYDROCODONE-ACETAMINOPHEN 5-325 MG PO TABS
1.0000 | ORAL_TABLET | Freq: Four times a day (QID) | ORAL | 0 refills | Status: AC | PRN
  Filled 2020-10-13: qty 10, 3d supply, fill #0

## 2020-10-13 MED ORDER — PHENYLEPHRINE 40 MCG/ML (10ML) SYRINGE FOR IV PUSH (FOR BLOOD PRESSURE SUPPORT)
PREFILLED_SYRINGE | INTRAVENOUS | Status: AC
  Filled 2020-10-13: qty 10

## 2020-10-13 MED ORDER — CHLORHEXIDINE GLUCONATE 0.12 % MT SOLN
15.0000 mL | Freq: Once | OROMUCOSAL | Status: AC
  Administered 2020-10-13: 15 mL via OROMUCOSAL
  Filled 2020-10-13: qty 15

## 2020-10-13 MED ORDER — ONDANSETRON HCL 4 MG/2ML IJ SOLN
INTRAMUSCULAR | Status: DC | PRN
  Administered 2020-10-13: 4 mg via INTRAVENOUS

## 2020-10-13 MED ORDER — LIDOCAINE-EPINEPHRINE 2 %-1:100000 IJ SOLN
INTRAMUSCULAR | Status: AC
  Filled 2020-10-13: qty 1

## 2020-10-13 MED ORDER — IBUPROFEN 800 MG PO TABS
800.0000 mg | ORAL_TABLET | Freq: Three times a day (TID) | ORAL | 0 refills | Status: DC | PRN
  Filled 2020-10-13: qty 20, 7d supply, fill #0

## 2020-10-13 SURGICAL SUPPLY — 15 items
CANISTER SUCT 3000ML PPV (MISCELLANEOUS) ×2 IMPLANT
CATH ROBINSON RED A/P 16FR (CATHETERS) ×2 IMPLANT
DEVICE MYOSURE LITE (MISCELLANEOUS) ×1 IMPLANT
DILATOR CANAL MILEX (MISCELLANEOUS) IMPLANT
GLOVE SURG LTX SZ6.5 (GLOVE) ×2 IMPLANT
GLOVE SURG UNDER POLY LF SZ7 (GLOVE) ×4 IMPLANT
GOWN STRL REUS W/ TWL LRG LVL3 (GOWN DISPOSABLE) ×2 IMPLANT
GOWN STRL REUS W/TWL LRG LVL3 (GOWN DISPOSABLE) ×4
KIT PROCEDURE FLUENT (KITS) ×2 IMPLANT
KIT TURNOVER KIT B (KITS) ×2 IMPLANT
PACK VAGINAL MINOR WOMEN LF (CUSTOM PROCEDURE TRAY) ×2 IMPLANT
PAD OB MATERNITY 4.3X12.25 (PERSONAL CARE ITEMS) ×2 IMPLANT
SEAL ROD LENS SCOPE MYOSURE (ABLATOR) ×2 IMPLANT
TOWEL GREEN STERILE FF (TOWEL DISPOSABLE) ×4 IMPLANT
UNDERPAD 30X36 HEAVY ABSORB (UNDERPADS AND DIAPERS) ×2 IMPLANT

## 2020-10-13 NOTE — Anesthesia Postprocedure Evaluation (Signed)
Anesthesia Post Note  Patient: Judy Foster  Procedure(s) Performed: DILATATION & CURETTAGE/HYSTEROSCOPY WITH MYOSURE (Uterus)     Patient location during evaluation: PACU Anesthesia Type: General Level of consciousness: awake and alert, patient cooperative and oriented Pain management: pain level controlled Vital Signs Assessment: post-procedure vital signs reviewed and stable Respiratory status: spontaneous breathing, nonlabored ventilation and respiratory function stable Cardiovascular status: blood pressure returned to baseline and stable Postop Assessment: no apparent nausea or vomiting and able to ambulate Anesthetic complications: no   No notable events documented.  Last Vitals:  Vitals:   10/13/20 1245 10/13/20 1259  BP: 121/65 126/84  Pulse: 70 74  Resp:  (!) 22  Temp:  36.7 C  SpO2: 100% 100%    Last Pain:  Vitals:   10/13/20 1259  TempSrc:   PainSc: 0-No pain                 Lidya Mccalister,E. Eriana Suliman

## 2020-10-13 NOTE — H&P (Signed)
Judy Foster is an 45 y.o. female G3p2A1 MWF with abnormal ultrasound showing uterus measuring 10.2 x 5.1 x 5.9cm but with endometrium 39m with two vascular lesions measuring 953mand 1861m This was done 04/2020.  She saw Dr. PraKennon Roundsd came for second opinion regarding treatment.  Conservative management vs surgical removal discussed.  Risks, benefits, alternatives discussed.  She decided to proceed with surgical removal with hysteroscopy, probable polyp removal and D&C.  Questions answered today.  Pertinent Gynecological History: Menses: regular Bleeding: normal Contraception: vasectomy DES exposure: denies Blood transfusions: none Sexually transmitted diseases: no past history Previous GYN Procedures:  none   Last mammogram: normal Date: 03/2020 Last pap:  ASCUS with neg HR HPV  Date: 03/25/2020 OB History: G3, P2 A1   Menstrual History: Patient's last menstrual period was 10/04/2020.    Past Medical History:  Diagnosis Date   Anal fissure    Anxiety    Asthma, exercise induced    as a teen   Cardiac arrhythmia    Depression    Dysmenorrhea    past   HLD (hyperlipidemia)    Hyperlipidemia    Mononucleosis 1993   Shingles     Past Surgical History:  Procedure Laterality Date   CRANIECTOMY SUBOCCIPITAL W/ CERVICAL LAMINECTOMY / CHIARI  2006    Family History  Problem Relation Age of Onset   Colon cancer Maternal Grandmother    Thyroid disease Maternal Grandmother    Stroke Maternal Grandmother    Kidney disease Maternal Grandmother    Leukemia Maternal Grandfather    Heart disease Maternal Grandfather    Hypertension Mother    Thyroid disease Mother        hypothyroid   Colon polyps Mother    Diabetes Mother    Breast cancer Mother 58 30 Social History:  reports that she has never smoked. She has never used smokeless tobacco. She reports that she does not drink alcohol and does not use drugs.  Allergies:  Allergies  Allergen Reactions   Penicillins Rash     Medications Prior to Admission  Medication Sig Dispense Refill Last Dose   acetaminophen (TYLENOL) 500 MG tablet Take 500-1,000 mg by mouth every 6 (six) hours as needed for moderate pain or headache.   Past Week   ALPRAZolam (XANAX) 0.5 MG tablet Take 1 tablet (0.5 mg total) by mouth 2 (two) times daily as needed for anxiety. (Patient taking differently: Take 0.25 mg by mouth daily as needed for anxiety.) 180 tablet 0 10/12/2020   escitalopram (LEXAPRO) 10 MG tablet Take 1 tablet (10 mg total) by mouth daily. 90 tablet 1 10/12/2020   Magnesium 500 MG TABS Take 1,000 mg by mouth at bedtime.   Past Week   Melatonin 3 MG CAPS Take 3 mg by mouth at bedtime.   10/12/2020   Probiotic Product (PROBIOTIC DAILY PO) Take 1 capsule by mouth at bedtime.   10/12/2020   ibuprofen (ADVIL) 200 MG tablet Take 400 mg by mouth every 6 (six) hours as needed for headache or moderate pain.   More than a month    Review of Systems  All other systems reviewed and are negative.  Blood pressure (!) 155/93, pulse 68, temperature 98.3 F (36.8 C), temperature source Oral, resp. rate 18, height '5\' 9"'$  (1.753 m), weight 95.3 kg, last menstrual period 10/04/2020, SpO2 98 %. Physical Exam Constitutional:      Appearance: Normal appearance.  Cardiovascular:     Rate and Rhythm:  Normal rate.  Pulmonary:     Effort: Pulmonary effort is normal.  Skin:    General: Skin is warm.  Neurological:     General: No focal deficit present.     Mental Status: She is alert.  Psychiatric:        Mood and Affect: Mood normal.    Results for orders placed or performed during the hospital encounter of 10/13/20 (from the past 24 hour(s))  CBC per protocol     Status: None   Collection Time: 10/13/20  9:48 AM  Result Value Ref Range   WBC 6.0 4.0 - 10.5 K/uL   RBC 4.02 3.87 - 5.11 MIL/uL   Hemoglobin 12.5 12.0 - 15.0 g/dL   HCT 37.4 36.0 - 46.0 %   MCV 93.0 80.0 - 100.0 fL   MCH 31.1 26.0 - 34.0 pg   MCHC 33.4 30.0 - 36.0  g/dL   RDW 12.3 11.5 - 15.5 %   Platelets 283 150 - 400 K/uL   nRBC 0.0 0.0 - 0.2 %  Pregnancy, urine POC     Status: None   Collection Time: 10/13/20 10:08 AM  Result Value Ref Range   Preg Test, Ur NEGATIVE NEGATIVE  Type and screen [place]     Status: None   Collection Time: 10/13/20 10:10 AM  Result Value Ref Range   ABO/RH(D) A POS    Antibody Screen NEG    Sample Expiration      10/16/2020,2359 Performed at [facility], [place] [address], [place], [place] [address]   ABO/Rh     Status: None   Collection Time: 10/13/20 10:15 AM  Result Value Ref Range   ABO/RH(D)      A POS Performed at [facility] [address] [place], [place] [address]     No results found.  Assessment/Plan: 73 you G3P2 MWF with endometrial lesions noted on ultrasound here for hysteroscopy, probable polyp resections, dilation and curettage.    Megan Salon 10/13/2020, 11:18 AM

## 2020-10-13 NOTE — Anesthesia Preprocedure Evaluation (Signed)
Anesthesia Evaluation  Patient identified by MRN, date of birth, ID band Patient awake    Reviewed: Allergy & Precautions, NPO status , Patient's Chart, lab work & pertinent test results  History of Anesthesia Complications Negative for: history of anesthetic complications  Airway Mallampati: I  TM Distance: >3 FB Neck ROM: Full    Dental  (+) Dental Advisory Given   Pulmonary neg pulmonary ROS,    breath sounds clear to auscultation       Cardiovascular negative cardio ROS   Rhythm:Regular Rate:Normal     Neuro/Psych Anxiety Depression negative neurological ROS     GI/Hepatic negative GI ROS, Neg liver ROS,   Endo/Other  obese  Renal/GU negative Renal ROS     Musculoskeletal   Abdominal (+) + obese,   Peds  Hematology negative hematology ROS (+)   Anesthesia Other Findings   Reproductive/Obstetrics                             Anesthesia Physical Anesthesia Plan  ASA: 2  Anesthesia Plan: General   Post-op Pain Management:    Induction: Intravenous  PONV Risk Score and Plan: 3 and Ondansetron, Dexamethasone and Scopolamine patch - Pre-op  Airway Management Planned: LMA  Additional Equipment: None  Intra-op Plan:   Post-operative Plan:   Informed Consent: I have reviewed the patients History and Physical, chart, labs and discussed the procedure including the risks, benefits and alternatives for the proposed anesthesia with the patient or authorized representative who has indicated his/her understanding and acceptance.     Dental advisory given  Plan Discussed with: CRNA and Surgeon  Anesthesia Plan Comments:         Anesthesia Quick Evaluation

## 2020-10-13 NOTE — Op Note (Signed)
10/13/2020  12:21 PM  PATIENT:  Judy Foster  45 y.o. female  PRE-OPERATIVE DIAGNOSIS:  Endometrial mass  POST-OPERATIVE DIAGNOSIS:  Endometrial mass  PROCEDURE:  Procedure(s): DILATATION & CURETTAGE/HYSTEROSCOPY WITH MYOSURE, RESECTION OF ENDOMETRIAL LESION  SURGEON:  Megan Salon  ASSISTANTS: OR staff.    ANESTHESIA:   General, LMA  ESTIMATED BLOOD LOSS: 10 mL  BLOOD ADMINISTERED:none   FLUIDS: 1000cc LR  UOP: 100cc  SPECIMEN:  endometrial curetting, possible polyp  DISPOSITION OF SPECIMEN:  PATHOLOGY  FINDINGS: thickened endometrium with polypoid anterior lesion, possible polyp  DESCRIPTION OF OPERATION: Patient was taken to the operating room.  She is placed in the supine position. SCDs were on her lower extremities and functioning properly. General anesthesia with an LMA was administered without difficulty. Dr. Vilinda Blanks, anesthesia, oversaw case.  Legs were then placed in the [place] in the low lithotomy position. The legs were lifted to the high lithotomy position and the Betadine prep was used on the inner thighs perineum and vagina x3. Patient was draped in a normal standard fashion. An in and out catheterization with a red rubber Foley catheter was performed. Approximately 100 cc of clear urine was noted. A bivalve speculum was placed the vagina. The anterior lip of the cervix was grasped with single-tooth tenaculum.  A paracervical block of 1% lidocaine mixed one-to-one with epinephrine (1:100,000 units).  10 cc was used total. The cervix is dilated up to #21 [facility] dilators. The endometrial cavity sounded to 11 cm.  A Myosure hysteroscope was obtained. Normal saline was used as a hysteroscopic fluid. The hysteroscope was advanced through the endocervical canal into the endometrial cavity. The tubal ostia were noted bilaterally. Additional findings included polypoid anterior lesion and thickened, fluffy appearing endometrium.  Using Myosure Lite device, the  lesion was resected and the tissue sampling was obtained.  The hysteroscope was removed. A #1 toothed curette was used to curette the cavity until rough gritty texture was noted in all quadrants. With revisualization of the hysteroscope, there was no longer any abnormal findings.  At this point no other procedure was needed and this procedure was ended. The hysteroscope was removed. The fluid deficit was 170cc. The tenaculum was removed from the anterior lip of the cervix. The speculum was removed from the vagina. The prep was cleansed of the patient's skin. The legs are positioned back in the supine position. Sponge, lap, needle, initially counts were correct x2. Patient was taken to recovery in stable condition.  COUNTS:  YES  PLAN OF CARE: Transfer to PACU

## 2020-10-13 NOTE — Anesthesia Procedure Notes (Signed)
Procedure Name: LMA Insertion Date/Time: 10/13/2020 11:49 AM Performed by: Harden Mo, CRNA Pre-anesthesia Checklist: Patient identified, Emergency Drugs available, Suction available and Patient being monitored Patient Re-evaluated:Patient Re-evaluated prior to induction Oxygen Delivery Method: Circle System Utilized Preoxygenation: Pre-oxygenation with 100% oxygen Induction Type: IV induction LMA: LMA inserted LMA Size: 4.0 Number of attempts: 1 Airway Equipment and Method: Bite block Placement Confirmation: positive ETCO2 Tube secured with: Tape Dental Injury: Teeth and Oropharynx as per pre-operative assessment

## 2020-10-13 NOTE — Transfer of Care (Signed)
Immediate Anesthesia Transfer of Care Note  Patient: Judy Foster  Procedure(s) Performed: DILATATION & CURETTAGE/HYSTEROSCOPY WITH MYOSURE (Uterus)  Patient Location: PACU  Anesthesia Type:General  Level of Consciousness: awake, alert  and oriented  Airway & Oxygen Therapy: Patient Spontanous Breathing  Post-op Assessment: Report given to RN, Post -op Vital signs reviewed and stable and Patient moving all extremities X 4  Post vital signs: Reviewed and stable  Last Vitals:  Vitals Value Taken Time  BP    Temp    Pulse 92 10/13/20 1227  Resp 15 10/13/20 1227  SpO2 93 % 10/13/20 1227  Vitals shown include unvalidated device data.  Last Pain:  Vitals:   10/13/20 0957  TempSrc:   PainSc: 0-No pain         Complications: No notable events documented.

## 2020-10-13 NOTE — Progress Notes (Signed)
Informed Dr. Glennon Mac of elevated blood pressures.  No order received.

## 2020-10-13 NOTE — Discharge Instructions (Signed)
Post-surgical Instructions, Outpatient Surgery  You may expect to feel dizzy, weak, and drowsy for as long as 24 hours after receiving the medicine that made you sleep (anesthetic). For the first 24 hours after your surgery:   Do not drive a car, ride a bicycle, participate in physical activities, or take public transportation until you are done taking narcotic pain medicines or as directed by Dr. Sabra Heck.  Do not drink alcohol or take tranquilizers.  Do not take medicine that has not been prescribed by your physicians.  Do not sign important papers or make important decisions while on narcotic pain medicines.  Have a responsible person with you.   PAIN MANAGEMENT Motrin '800mg'$ .  (This is the same as 4-'200mg'$  over the counter tablets of Motrin or ibuprofen.)  You may take this every eight hours or as needed for cramping.   Vicodin 5/'325mg'$ .  For more severe pain, take one or two tablets every four to six hours as needed for pain control.  (Remember that narcotic pain medications increase your risk of constipation.  If this becomes a problem, you may take an over the counter stool softener like Colace '100mg'$  up to four times a day.)  Do not take Tylenol if you take any Vicodin because the pain medication contains tylenol.   DO'S AND DON'T'S Do not take a tub bath for one week.  You may shower on the first day after your surgery Do not do any heavy lifting for one to two weeks.  This increases the chance of bleeding. Do move around as you feel able.  Stairs are fine.  You may begin to exercise again as you feel able.  Do not lift any weights for two weeks. Do not put anything in the vagina for two weeks--no tampons, intercourse, or douching.    REGULAR MEDIATIONS/VITAMINS: You may restart all of your regular medications as prescribed. You may restart all of your vitamins as you normally take them.    PLEASE CALL OR SEEK MEDICAL CARE IF: You have persistent nausea and vomiting.  You have trouble  eating or drinking.  You have an oral temperature above 100.5.  You have constipation that is not helped by adjusting diet or increasing fluid intake. Pain medicines are a common cause of constipation.  You have heavy vaginal bleeding

## 2020-10-14 ENCOUNTER — Encounter (HOSPITAL_COMMUNITY): Payer: Self-pay | Admitting: Obstetrics & Gynecology

## 2020-10-14 ENCOUNTER — Other Ambulatory Visit (HOSPITAL_COMMUNITY): Payer: Self-pay

## 2020-10-14 LAB — SURGICAL PATHOLOGY

## 2020-10-19 ENCOUNTER — Telehealth (HOSPITAL_BASED_OUTPATIENT_CLINIC_OR_DEPARTMENT_OTHER): Payer: Self-pay | Admitting: Obstetrics & Gynecology

## 2020-10-19 NOTE — Telephone Encounter (Signed)
Call patient and left message to please call the office to step up annual appointment for 2023 at the end February .

## 2020-10-20 ENCOUNTER — Other Ambulatory Visit (HOSPITAL_COMMUNITY): Payer: Self-pay

## 2020-10-20 ENCOUNTER — Other Ambulatory Visit (HOSPITAL_BASED_OUTPATIENT_CLINIC_OR_DEPARTMENT_OTHER): Payer: Self-pay | Admitting: Obstetrics & Gynecology

## 2020-10-20 MED ORDER — HYDROCHLOROTHIAZIDE 25 MG PO TABS
25.0000 mg | ORAL_TABLET | Freq: Every day | ORAL | 1 refills | Status: DC
  Filled 2020-10-20 – 2020-11-19 (×2): qty 30, 30d supply, fill #0
  Filled 2020-12-14: qty 30, 30d supply, fill #1

## 2020-10-23 DIAGNOSIS — N840 Polyp of corpus uteri: Secondary | ICD-10-CM

## 2020-10-28 ENCOUNTER — Other Ambulatory Visit (HOSPITAL_COMMUNITY): Payer: Self-pay

## 2020-11-16 NOTE — Progress Notes (Signed)
A  Orma Render, DNP, AGNP-c [facility] [address]  [place], [place] [address] [phone]  New patient visit   Patient: Judy Foster   DOB: 06-06-1975   45 y.o. Female  MRN: [id number] Visit Date: 11/19/2020  Patient Care Team: Rhapsody Wolven, Coralee Pesa, NP as PCP - General (Nurse Practitioner)  Today's healthcare provider: Orma Render, NP   Chief Complaint  Patient presents with   Establish Care    Patient states she has weight and cholesterol concerns.   Subjective    Judy Foster is a 45 y.o. female who presents today as a new patient to establish care.  HPI Judy Foster has concerns today with increased weight gain, cholesterol, hypertension, and insomnia.  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  Weight Gain Judy Foster has noticed about a 20-30 lb weight gain in the last 2.5 years.  She has tried several methods for weight loss without success including strict calorie restriction dieting, changing medications for anxiety and depression, and [place] weight loss program. She reports that none of these efforts have been helpful in loosing and maintaining weight loss.  She reports that she does not have much energy or time for regular exercise due to her schedule.  She thinks that her diet could be improved, but is not terrible.  She also reports feeling "drained" all the time with no energy.  This past spring she was diagnosed with pre-diabetes and high cholesterol.  A few months ago she was diagnosed with HTN.  These new health findings have her very concerned and eager to lose weight for her overall health.   Cholesterol Judy Foster endorses elevated cholesterol levels since she  was 21. She reports a family history of cardiac disease in her maternal grandfather and stroke in her maternal grandmother. Recent labs (Feb 2022) showed continued elevation in her lipids, per patient.  She has not tried any medications for this, but tells me with her increased weight and now hypertension, she is more concerned and would like to consider medication options.   HTN She endorses elevated BP readings on several occassions.  Her GYN called in HCTZ for her to start, however, she was unaware that this medication was called to the pharmacy, therefore, she has not started it.  She endorses almost daily "band-like" headaches, but tells me she feels these are due to stress.  She denies vision changes, CP, palpitations, or dizziness.  Her last labs were in February.   Insomnia Judy Foster reports a long standing history of anxiety and depression symptoms which have been farily well controlled until starting school.  She reports that she has worked with her psychologist to manage her medications and at this time she does not feel adjustments are needed as her symptoms are primarily related to school/work stresses, which will end this Spring when she graduates.  She does have concerns with insomnia due to ruminating thoughts at night.  She tells me until starting school she would only take PRN xanax 0.25mg  once or twice in a year. Now she finds herself taking this every night for sleep and anxiety and she does not like using this medication consistently.     Past Medical History:  Diagnosis Date   Anal fissure    Anxiety    Asthma, exercise induced    as a teen   Cardiac arrhythmia    Depression    Dysmenorrhea    past   HLD (hyperlipidemia)    Hyperlipidemia    Mononucleosis 1993   Shingles    Past Surgical History:  Procedure Laterality Date   CRANIECTOMY SUBOCCIPITAL W/ CERVICAL LAMINECTOMY / CHIARI  2006   [place] N/A 10/13/2020    Procedure: [place];  Surgeon: Megan Salon, MD;  Location: [facility];  Service: Gynecology;  Laterality: N/A;   Family Status  Relation Name Status   MGM  Deceased   MGF  Deceased   Mother  Alive   Daughter  Alive   Son  Alive   Family History  Problem Relation Age of Onset   Colon cancer Maternal Grandmother    Thyroid disease Maternal Grandmother    Stroke Maternal Grandmother    Kidney disease Maternal Grandmother    Leukemia Maternal Grandfather    Heart disease Maternal Grandfather    Hypertension Mother    Thyroid disease Mother        hypothyroid   Colon polyps Mother    Diabetes Mother    Breast cancer Mother 87   Social History   Socioeconomic History   Marital status: Married    Spouse name: Not on file   Number of children: 2   Years of education: Not on file   Highest education level: Not on file  Occupational History   Occupation: Optician, dispensing: [employer]  Tobacco Use   Smoking status: Never   Smokeless tobacco: Never  Vaping Use   Vaping Use: Never used  Substance and Sexual Activity   Alcohol use: No    Alcohol/week: 0.0 standard drinks   Drug use: No   Sexual activity: Yes    Partners: Male    Comment: Husband has Vasectomy  Other Topics Concern   Not on file  Social History Narrative   Not on file   Social Determinants of Health   Financial Resource Strain: Not on file  Food Insecurity: Not on file  Transportation Needs: Not on file  Physical Activity: Not on file  Stress: Not on file  Social Connections: Not on file   Outpatient Medications Prior to Visit  Medication Sig   ALPRAZolam (XANAX) 0.5 MG tablet Take 1 tablet (0.5 mg total) by mouth 2 (two) times daily as needed for anxiety. (Patient taking differently: Take 0.25 mg by mouth daily as needed for anxiety.)   escitalopram (LEXAPRO) 10 MG tablet Take 1 tablet (10 mg total) by mouth daily.   Magnesium 500 MG TABS Take 1,000 mg by  mouth at bedtime.   Probiotic Product (PROBIOTIC DAILY PO) Take 1 capsule by mouth at bedtime.   [DISCONTINUED] acetaminophen (TYLENOL) 500 MG tablet Take 500-1,000 mg by mouth every 6 (six) hours as needed for moderate pain or headache.   [DISCONTINUED] Melatonin 3 MG CAPS Take 3 mg by mouth at bedtime.   hydrochlorothiazide (HYDRODIURIL) 25 MG tablet Take 1 tablet (25 mg total) by mouth daily.   [DISCONTINUED] ibuprofen (ADVIL) 800 MG tablet Take 1 tablet (800 mg total) by mouth every 8 (eight) hours as needed for headache or moderate pain.   No facility-administered medications prior to visit.   Allergies  Allergen Reactions   Penicillins Rash    Immunization History  Administered Date(s) Administered   DTaP 02/22/2008   Influenza,inj,Quad PF,6+ Mos 11/19/2020   PFIZER(Purple Top)SARS-COV-2 Vaccination 03/05/2019, 03/26/2019   Tdap 06/22/2018    Health Maintenance  Topic Date Due   HIV Screening  Never done   Hepatitis C Screening  Never done   COVID-19 Vaccine (3 - Booster for Pfizer series) 08/23/2019   PAP SMEAR-Modifier  03/26/2023   TETANUS/TDAP  06/21/2028   INFLUENZA VACCINE  Completed   HPV VACCINES  Aged Out    Patient Care Team: Keyona Emrich, Coralee Pesa, NP as PCP - General (Nurse Practitioner)  Review of Systems All review of systems negative except what is listed in the HPI    Objective    BP 126/85   Pulse 83   Ht 5\' 9"  (1.753 m)   Wt 213 lb 6.4 oz (96.8 kg)   SpO2 100%   BMI 31.51 kg/m  Physical Exam Vitals and nursing note reviewed.  Constitutional:      General: She is not in acute distress.    Appearance: Normal appearance.  HENT:     Head: Normocephalic.  Eyes:     Extraocular Movements: Extraocular movements intact.     Conjunctiva/sclera: Conjunctivae normal.     Pupils: Pupils are equal, round, and reactive to light.  Neck:     Vascular: No carotid bruit.  Cardiovascular:     Rate and Rhythm: Normal rate and regular rhythm.     Pulses:  Normal pulses.     Heart sounds: Normal heart sounds. No murmur heard. Pulmonary:     Effort: Pulmonary effort is normal. No respiratory distress.     Breath sounds: Normal breath sounds.  Abdominal:     General: Abdomen is flat. Bowel sounds are normal. There is no distension.     Palpations: Abdomen is soft.     Tenderness: There is no abdominal tenderness. There is no right CVA tenderness, left CVA tenderness or guarding.  Musculoskeletal:        General: Normal range of motion.     Cervical back: Normal range of motion. No tenderness.     Right lower leg: No edema.     Left lower leg: No edema.  Lymphadenopathy:     Cervical: No cervical adenopathy.  Skin:    General: Skin is warm and dry.     Capillary Refill: Capillary refill takes less than 2 seconds.  Neurological:     General: No focal deficit present.     Mental Status: She is alert and oriented to person, place, and time.     Motor: No weakness.     Gait: Gait normal.  Psychiatric:        Mood and Affect: Mood normal.        Behavior: Behavior normal.        Thought Content: Thought content normal.        Judgment: Judgment normal.     Depression Screen PHQ 2/9 Scores 11/19/2020 08/19/2020 02/26/2016  PHQ - 2 Score 2 0 0  PHQ- 9 Score 11 - -  Exception Documentation - - Other- indicate reason in comment box  Some encounter information is confidential and restricted. Go to Review Flowsheets activity to see all data.   No results found for any visits on 11/19/20.  Assessment & Plan      Problem List Items Addressed This Visit     GAD (generalized anxiety disorder)    Working with psychiatry on medication management.  Symptoms are exacerbated with  added stressors from school, but no alarm symptoms present today. Recommend trial of hydroxyzine for anxiety and sleep to help reduce use of xanax. Will follow      Relevant Medications   hydrOXYzine (ATARAX/VISTARIL) 25 MG tablet   MDD (major depressive disorder),  recurrent, in partial remission ([place])    Symptoms worse with added pressures and stresses with school.  No alarm symptoms present.  Currently working with psychiatry and medication management and counseling.  Will follow.       Relevant Medications   hydrOXYzine (ATARAX/VISTARIL) 25 MG tablet   Insomnia    Appears to be primarily related to increased anxiety.  Sleeping well by using xanax nightly, but does not like taking controlled substances chronically.  Will trial hydroxyzine at bedtime for anxiety and sleep and see if this is effective.  Will follow.        Relevant Medications   hydrOXYzine (ATARAX/VISTARIL) 25 MG tablet   Prediabetes - Primary    Discovered in February of this year.  No medications. Concerns with increased weight gain and effect on this. Working on diet. Limited activity due to time demands with work and school.  Will check labs today Patient would be good candidate for GLP-1 for cardiovascular protection, blood glucose management, and weight loss if found to be elevated.  Will monitor labs and make changes to plan of care based on results.       Relevant Orders   CBC With Differential   Comprehensive metabolic panel   Lipid panel   Hemoglobin A1c   Weight gain    Increased weight gain with difficultly loosing and keeping weight off. Historical strict dietary plans ineffective.  Thyroid normal in february.  pre-DM, HTN, and HLD along with elevated BMI increase CV risk factors.  Discussed medication and diet options that may be helpful for weight loss.  Unfortunately, insurance does not cover weight loss medications, therefore, most options would not be affordable.  Will obtain labs today for further evaluation.  Will trial phentermine to see if this is helpful to jump start weight loss.  Discussed warning signs, such as increased BP or anxiety, and recommend stopping medication and contacting me immediately if these present.  Will f/u in 3 months or  sooner if needed.       Relevant Medications   phentermine 37.5 MG capsule   Other Relevant Orders   CBC With Differential   Comprehensive metabolic panel   Lipid panel   Hemoglobin A1c   Familial hypercholesterolemia    Present since late teens. Diet and exercise have made no impact in the past, per patient.  Will obtain labs today and determine RF. Discussed with patient that statin will likely be recommended, but would like to see lab results before making decision on dosing.  Labs to be reviewed and changes made to plan of care as appropriate once resulted.       Relevant Orders   CBC With Differential   Comprehensive metabolic panel   Lipid panel   Hemoglobin A1c   Chronic fatigue    Increased fatigue in the setting of worsening anxiety, depression, pre-DM, and insomnia.  Symptoms are vague, but unclear if there are underlying issues that may be contributing.  Will check labs today. Labs to be reviewed and changes made to plan of care as appropriate once resulted.       Relevant Orders   VITAMIN D 25 Hydroxy (Vit-D Deficiency, Fractures)   Other Visit Diagnoses  Need for influenza vaccination       Relevant Orders   Flu Vaccine QUAD 6+ mos PF IM (Fluarix Quad PF)   Need for immunization against influenza       Relevant Orders   Flu Vaccine QUAD 33mo+IM (Fluarix, Fluzone & Alfiuria Quad PF) (Completed)        Return in about 3 months (around 02/18/2021) for Weight and Lipids.      Robie Oats, Coralee Pesa, NP, DNP, AGNP-C [facility]

## 2020-11-16 NOTE — Patient Instructions (Addendum)
Recommendations from today's visit: Blood pressure looks pretty good today. I would like you to start the HCTZ once a day and monitor your BP at home and let me know if it is consistently staying above 120/80 while at rest. Or if you start to have any symptoms.  If your BP is consistently dropping below 030 systolic, or symptoms present, please let me know.  I have sent phentermine to the pharmacy for you for weight loss. If your anxiety increases with this, please let me know If your A1c is elevated, we may be able to get a GLP-1 medication approved for you. Unfortunately, it doesn't look like your insurance covers weight loss medication otherwise so our options are very limited at this time.  We will check some labs today to evaluate your cholesterol and any other risk factors.  I will let you know recommendations on your cholesterol once I have reviewed the results and have a good idea of where you stand right now.  I have sent hydroxyzine to the pharmacy for you to try at bedtime for sleep. This can be very helpful with anxiety associated sleep issues and help you to get a better nights rest, which may also help your BP and your weight.  It was SO GOOD to see you today! Please let me know if you have any questions or concerns and I will be in touch soon.   Information on diet, exercise, and health maintenance recommendations are listed below. This is information to help you be sure you are on track for optimal health and monitoring.   Please look over this and let us know if you have any questions or if you have completed any of the health maintenance outside of [facility] so that we can be sure your records are up to date.  ___________________________________________________________  Thank you for choosing [facility] for your Primary Care needs. I am excited for the opportunity to partner with you to meet your health care goals. It was a pleasure meeting you  today!  I am an Adult-Geriatric Nurse Practitioner with a background in caring for patients for more than 20 years. I provide primary care and sports medicine services to patients age 68 and older within this office. I am also the director of the APP Fellowship with [facility].   I am passionate about providing the best service to you through preventive medicine and supportive care. I consider you a part of the medical team and value your input. I work diligently to ensure that you are heard and your needs are met in a safe and effective manner. I want you to feel comfortable with me as your provider and want you to know that your health concerns are important to me.  For your information, our office hours are Monday- Friday 8:00 AM - 5:00 PM At this time I am not in the office on Wednesdays.  If you have questions or concerns, please call our office at [phone] or send [place] a MyChart message and we will respond as quickly as possible.   For all urgent or time sensitive needs we ask that you please call the office to avoid delays. MyChart is not constantly monitored and replies may take up to 72 business hours.  MyChart Policy: MyChart allows for you to see your visit notes, after visit summary, provider recommendations, lab and tests results, make an appointment, request refills, and contact your provider or the office for non-urgent questions  or concerns. Providers are seeing patients during normal business hours and do not have built in time to review MyChart messages.  We ask that you allow a minimum of 4 business days for responses to [employer]. For this reason, please do not send urgent requests through [place]. Please call the office at [phone]. Complex MyChart concerns may require a visit. Your provider may request you schedule a virtual or in person visit to ensure we are providing the best care possible. MyChart messages sent after 4:00 PM on Friday will not be received by  the provider until Monday morning.    Lab and Test Results: You will receive your lab and test results on MyChart as soon as they are completed and results have been sent by the lab or testing facility. Due to this service, you will receive your results BEFORE your provider.  I review lab and tests results each morning prior to seeing patients. Some results require collaboration with other providers to ensure you are receiving the most appropriate care. For this reason, we ask that you please allow a minimum of 4 business days for your provider to receive and review lab and test results and contact you about these.  Most lab and test result comments from the provider will be sent through [place]. Your provider may recommend changes to the plan of care, follow-up visits, repeat testing, ask questions, or request an office visit to discuss these results. You may reply directly to this message or call the office at [phone] to provide information for the provider or set up an appointment. In some instances, you will be called with test results and recommendations. Please let us know if this is preferred and we will make note of this in your chart to provide this for you.    If you have not heard a response to your lab or test results in 72 business hours, please call the office to let us know.   After Hours: For all non-emergency after hours needs, please call the office at [phone] and select the option to reach the on-call provider service. On-call services are shared between multiple [facility] offices and therefore it will not be possible to speak directly with your provider. On-call providers may provide medical advice and recommendations, but are unable to provide refills for maintenance medications.  For all emergency or urgent medical needs after normal business hours, we recommend that you seek care at the closest Urgent Care or Emergency Department to ensure appropriate treatment in a  timely manner.  [facility] [place] [facility] has a 24 hour emergency room located on the ground floor for your convenience.    Please do not hesitate to reach out to [place] with concerns.   Thank you, again, for choosing me as your health care partner. I appreciate your trust and look forward to learning more about you.   Worthy Keeler, DNP, AGNP-c ___________________________________________________________  Health Maintenance Recommendations Screening Testing Mammogram Every 1 -2 years based on history and risk factors Starting at age 2 Pap Smear Ages 21-39 every 3 years Ages 69-65 every 5 years with HPV testing More frequent testing may be required based on results and history Colon Cancer Screening Every 1-10 years based on test performed, risk factors, and history Starting at age 74 Bone Density Screening Every 2-10 years based on history Starting at age 110 for women Recommendations for men differ based on medication usage, history, and risk factors AAA Screening One time ultrasound Men 65-75 years  old who have every smoked Lung Cancer Screening Low Dose Lung CT every 12 months Age 73-80 years with a 30 pack-year smoking history who still smoke or who have quit within the last 15 years  Screening Labs Routine  Labs: Complete Blood Count (CBC), Complete Metabolic Panel (CMP), Cholesterol (Lipid Panel) Every 6-12 months based on history and medications May be recommended more frequently based on current conditions or previous results Hemoglobin A1c Lab Every 3-12 months based on history and previous results Starting at age 68 or earlier with diagnosis of diabetes, high cholesterol, BMI >26, and/or risk factors Frequent monitoring for patients with diabetes to ensure blood sugar control Thyroid Panel (TSH w/ T3 & T4) Every 6 months based on history, symptoms, and risk factors May be repeated more often if on medication HIV One time testing for all patients 3 and  older May be repeated more frequently for patients with increased risk factors or exposure Hepatitis C One time testing for all patients 90 and older May be repeated more frequently for patients with increased risk factors or exposure Gonorrhea, Chlamydia Every 12 months for all sexually active persons 13-24 years Additional monitoring may be recommended for those who are considered high risk or who have symptoms PSA Men 42-50 years old with risk factors Additional screening may be recommended from age 66-69 based on risk factors, symptoms, and history  Vaccine Recommendations Tetanus Booster All adults every 10 years Flu Vaccine All patients 6 months and older every year COVID Vaccine All patients 12 years and older Initial dosing with booster May recommend additional booster based on age and health history HPV Vaccine 2 doses all patients age 52-26 Dosing may be considered for patients over 26 Shingles Vaccine (Shingrix) 2 doses all adults 40 years and older Pneumonia (Pneumovax 23) All adults 60 years and older May recommend earlier dosing based on health history Pneumonia (Prevnar 66) All adults 23 years and older Dosed 1 year after Pneumovax 23  Additional Screening, Testing, and Vaccinations may be recommended on an individualized basis based on family history, health history, risk factors, and/or exposure.  __________________________________________________________  Diet Recommendations for All Patients  I recommend that all patients maintain a diet low in saturated fats, carbohydrates, and cholesterol. While this can be challenging at first, it is not impossible and small changes can make big differences.  Things to try: Decreasing the amount of soda, sweet tea, and/or juice to one or less per day and replace with water While water is always the first choice, if you do not like water you may consider adding a water additive without sugar to improve the taste other  sugar free drinks Replace potatoes with a brightly colored vegetable at dinner Use healthy oils, such as canola oil or olive oil, instead of butter or hard margarine Limit your bread intake to two pieces or less a day Replace regular pasta with low carb pasta options Bake, broil, or grill foods instead of frying Monitor portion sizes  Eat smaller, more frequent meals throughout the day instead of large meals  An important thing to remember is, if you love foods that are not great for your health, you don't have to give them up completely. Instead, allow these foods to be a reward when you have done well. Allowing yourself to still have special treats every once in a while is a nice way to tell yourself thank you for working hard to keep yourself healthy.   Also remember that every day is  a new day. If you have a bad day and "fall off the wagon", you can still climb right back up and keep moving along on your journey!  We have resources available to help you!  Some websites that may be helpful include: www.http://carter.biz/  Www.VeryWellFit.com _____________________________________________________________  Activity Recommendations for All Patients  I recommend that all adults get at least 20 minutes of moderate physical activity that elevates your heart rate at least 5 days out of the week.  Some examples include: Walking or jogging at a pace that allows you to carry on a conversation Cycling (stationary bike or outdoors) Water aerobics Yoga Weight lifting Dancing If physical limitations prevent you from putting stress on your joints, exercise in a pool or seated in a chair are excellent options.  Do determine your MAXIMUM heart rate for activity: YOUR AGE - 220 = MAX HeartRate   Remember! Do not push yourself too hard.  Start slowly and build up your pace, speed, weight, time in exercise, etc.  Allow your body to rest between exercise and get good sleep. You will need more water than  normal when you are exerting yourself. Do not wait until you are thirsty to drink. Drink with a purpose of getting in at least 8, 8 ounce glasses of water a day plus more depending on how much you exercise and sweat.    If you begin to develop dizziness, chest pain, abdominal pain, jaw pain, shortness of breath, headache, vision changes, lightheadedness, or other concerning symptoms, stop the activity and allow your body to rest. If your symptoms are severe, seek emergency evaluation immediately. If your symptoms are concerning, but not severe, please let us know so that we can recommend further evaluation.   ________________________________________________________________

## 2020-11-17 ENCOUNTER — Other Ambulatory Visit (HOSPITAL_COMMUNITY): Payer: Self-pay

## 2020-11-19 ENCOUNTER — Other Ambulatory Visit (HOSPITAL_COMMUNITY): Payer: Self-pay

## 2020-11-19 ENCOUNTER — Other Ambulatory Visit: Payer: Self-pay

## 2020-11-19 ENCOUNTER — Encounter (HOSPITAL_BASED_OUTPATIENT_CLINIC_OR_DEPARTMENT_OTHER): Payer: Self-pay | Admitting: Nurse Practitioner

## 2020-11-19 ENCOUNTER — Ambulatory Visit (HOSPITAL_BASED_OUTPATIENT_CLINIC_OR_DEPARTMENT_OTHER): Payer: 59 | Admitting: Nurse Practitioner

## 2020-11-19 VITALS — BP 126/85 | HR 83 | Ht 69.0 in | Wt 213.4 lb

## 2020-11-19 DIAGNOSIS — R7303 Prediabetes: Secondary | ICD-10-CM

## 2020-11-19 DIAGNOSIS — F3341 Major depressive disorder, recurrent, in partial remission: Secondary | ICD-10-CM | POA: Diagnosis not present

## 2020-11-19 DIAGNOSIS — Z23 Encounter for immunization: Secondary | ICD-10-CM | POA: Diagnosis not present

## 2020-11-19 DIAGNOSIS — F5101 Primary insomnia: Secondary | ICD-10-CM | POA: Diagnosis not present

## 2020-11-19 DIAGNOSIS — E7801 Familial hypercholesterolemia: Secondary | ICD-10-CM

## 2020-11-19 DIAGNOSIS — R635 Abnormal weight gain: Secondary | ICD-10-CM | POA: Insufficient documentation

## 2020-11-19 DIAGNOSIS — F411 Generalized anxiety disorder: Secondary | ICD-10-CM | POA: Diagnosis not present

## 2020-11-19 DIAGNOSIS — I10 Essential (primary) hypertension: Secondary | ICD-10-CM

## 2020-11-19 DIAGNOSIS — E559 Vitamin D deficiency, unspecified: Secondary | ICD-10-CM

## 2020-11-19 DIAGNOSIS — R5382 Chronic fatigue, unspecified: Secondary | ICD-10-CM | POA: Insufficient documentation

## 2020-11-19 MED ORDER — PHENTERMINE HCL 37.5 MG PO CAPS
ORAL_CAPSULE | ORAL | 2 refills | Status: DC
Start: 2020-11-19 — End: 2021-02-18
  Filled 2020-11-19: qty 30, 30d supply, fill #0
  Filled 2020-12-14: qty 30, 30d supply, fill #1

## 2020-11-19 MED ORDER — HYDROXYZINE HCL 25 MG PO TABS
25.0000 mg | ORAL_TABLET | Freq: Every evening | ORAL | 11 refills | Status: DC | PRN
  Filled 2020-11-19: qty 60, 30d supply, fill #0

## 2020-11-19 NOTE — Assessment & Plan Note (Signed)
Recommend starting HCTZ and monitor BP at home.  Discussed warning signs and readings that would warrant notification to this office.  Goal BP <120/80 Will obtain labs today.  F/U in 3 months

## 2020-11-19 NOTE — Assessment & Plan Note (Signed)
Appears to be primarily related to increased anxiety.  Sleeping well by using xanax nightly, but does not like taking controlled substances chronically.  Will trial hydroxyzine at bedtime for anxiety and sleep and see if this is effective.  Will follow.

## 2020-11-19 NOTE — Assessment & Plan Note (Signed)
Increased fatigue in the setting of worsening anxiety, depression, pre-DM, and insomnia.  Symptoms are vague, but unclear if there are underlying issues that may be contributing.  Will check labs today. Labs to be reviewed and changes made to plan of care as appropriate once resulted.

## 2020-11-19 NOTE — Assessment & Plan Note (Signed)
Present since late teens. Diet and exercise have made no impact in the past, per patient.  Will obtain labs today and determine RF. Discussed with patient that statin will likely be recommended, but would like to see lab results before making decision on dosing.  Labs to be reviewed and changes made to plan of care as appropriate once resulted.

## 2020-11-19 NOTE — Assessment & Plan Note (Signed)
Discovered in February of this year.  No medications. Concerns with increased weight gain and effect on this. Working on diet. Limited activity due to time demands with work and school.  Will check labs today Patient would be good candidate for GLP-1 for cardiovascular protection, blood glucose management, and weight loss if found to be elevated.  Will monitor labs and make changes to plan of care based on results.

## 2020-11-19 NOTE — Assessment & Plan Note (Signed)
Symptoms worse with added pressures and stresses with school.  No alarm symptoms present.  Currently working with psychiatry and medication management and counseling.  Will follow.

## 2020-11-19 NOTE — Assessment & Plan Note (Signed)
Increased weight gain with difficultly loosing and keeping weight off. Historical strict dietary plans ineffective.  Thyroid normal in february.  pre-DM, HTN, and HLD along with elevated BMI increase CV risk factors.  Discussed medication and diet options that may be helpful for weight loss.  Unfortunately, insurance does not cover weight loss medications, therefore, most options would not be affordable.  Will obtain labs today for further evaluation.  Will trial phentermine to see if this is helpful to jump start weight loss.  Discussed warning signs, such as increased BP or anxiety, and recommend stopping medication and contacting me immediately if these present.  Will f/u in 3 months or sooner if needed.

## 2020-11-19 NOTE — Assessment & Plan Note (Signed)
Working with psychiatry on medication management.  Symptoms are exacerbated with added stressors from school, but no alarm symptoms present today. Recommend trial of hydroxyzine for anxiety and sleep to help reduce use of xanax. Will follow

## 2020-11-20 LAB — CBC WITH DIFFERENTIAL
Basophils Absolute: 0 10*3/uL (ref 0.0–0.2)
Basos: 1 %
EOS (ABSOLUTE): 0.1 10*3/uL (ref 0.0–0.4)
Eos: 2 %
Hematocrit: 40.6 % (ref 34.0–46.6)
Hemoglobin: 13.7 g/dL (ref 11.1–15.9)
Immature Grans (Abs): 0 10*3/uL (ref 0.0–0.1)
Immature Granulocytes: 0 %
Lymphocytes Absolute: 2 10*3/uL (ref 0.7–3.1)
Lymphs: 37 %
MCH: 31.3 pg (ref 26.6–33.0)
MCHC: 33.7 g/dL (ref 31.5–35.7)
MCV: 93 fL (ref 79–97)
Monocytes Absolute: 0.4 10*3/uL (ref 0.1–0.9)
Monocytes: 7 %
Neutrophils Absolute: 2.9 10*3/uL (ref 1.4–7.0)
Neutrophils: 53 %
RBC: 4.38 x10E6/uL (ref 3.77–5.28)
RDW: 12.3 % (ref 11.7–15.4)
WBC: 5.4 10*3/uL (ref 3.4–10.8)

## 2020-11-20 LAB — COMPREHENSIVE METABOLIC PANEL
ALT: 23 IU/L (ref 0–32)
AST: 20 IU/L (ref 0–40)
Albumin/Globulin Ratio: 1.7 (ref 1.2–2.2)
Albumin: 4.8 g/dL (ref 3.8–4.8)
Alkaline Phosphatase: 67 IU/L (ref 44–121)
BUN/Creatinine Ratio: 14 (ref 9–23)
BUN: 11 mg/dL (ref 6–24)
Bilirubin Total: 0.3 mg/dL (ref 0.0–1.2)
CO2: 21 mmol/L (ref 20–29)
Calcium: 9.3 mg/dL (ref 8.7–10.2)
Chloride: 101 mmol/L (ref 96–106)
Creatinine, Ser: 0.76 mg/dL (ref 0.57–1.00)
Globulin, Total: 2.8 g/dL (ref 1.5–4.5)
Glucose: 96 mg/dL (ref 70–99)
Potassium: 4.6 mmol/L (ref 3.5–5.2)
Sodium: 139 mmol/L (ref 134–144)
Total Protein: 7.6 g/dL (ref 6.0–8.5)
eGFR: 100 mL/min/{1.73_m2} (ref >59)

## 2020-11-20 LAB — LIPID PANEL
Chol/HDL Ratio: 7.1 ratio — ABNORMAL HIGH (ref 0.0–4.4)
Cholesterol, Total: 293 mg/dL — ABNORMAL HIGH (ref 100–199)
HDL: 41 mg/dL (ref >39)
LDL Chol Calc (NIH): 198 mg/dL — ABNORMAL HIGH (ref 0–99)
Triglycerides: 271 mg/dL — ABNORMAL HIGH (ref 0–149)
VLDL Cholesterol Cal: 54 mg/dL — ABNORMAL HIGH (ref 5–40)

## 2020-11-20 LAB — VITAMIN D 25 HYDROXY (VIT D DEFICIENCY, FRACTURES): Vit D, 25-Hydroxy: 25.6 ng/mL — ABNORMAL LOW (ref 30.0–100.0)

## 2020-11-20 LAB — HEMOGLOBIN A1C
Est. average glucose Bld gHb Est-mCnc: 120 mg/dL
Hgb A1c MFr Bld: 5.8 % — ABNORMAL HIGH (ref 4.8–5.6)

## 2020-11-23 ENCOUNTER — Other Ambulatory Visit (HOSPITAL_COMMUNITY): Payer: Self-pay

## 2020-11-23 ENCOUNTER — Encounter (HOSPITAL_BASED_OUTPATIENT_CLINIC_OR_DEPARTMENT_OTHER): Payer: Self-pay | Admitting: Nurse Practitioner

## 2020-11-23 DIAGNOSIS — E559 Vitamin D deficiency, unspecified: Secondary | ICD-10-CM | POA: Insufficient documentation

## 2020-11-23 MED ORDER — OZEMPIC (0.25 OR 0.5 MG/DOSE) 2 MG/1.5ML ~~LOC~~ SOPN
PEN_INJECTOR | SUBCUTANEOUS | 1 refills | Status: DC
Start: 2020-11-23 — End: 2021-02-18
  Filled 2020-11-23: qty 1.5, 31d supply, fill #0
  Filled 2020-12-16: qty 1.5, 28d supply, fill #0

## 2020-11-23 MED ORDER — ROSUVASTATIN CALCIUM 10 MG PO TABS
10.0000 mg | ORAL_TABLET | Freq: Every day | ORAL | 3 refills | Status: DC
  Filled 2020-11-23: qty 30, 30d supply, fill #0
  Filled 2020-12-14: qty 30, 30d supply, fill #1
  Filled 2021-01-19: qty 30, 30d supply, fill #2

## 2020-11-23 MED ORDER — VITAMIN D (ERGOCALCIFEROL) 1.25 MG (50000 UNIT) PO CAPS
50000.0000 [IU] | ORAL_CAPSULE | ORAL | 0 refills | Status: DC
  Filled 2020-11-23: qty 4, 28d supply, fill #0

## 2020-11-23 NOTE — Addendum Note (Signed)
Addended by: Keandre Linden, Clarise Cruz E on: 11/23/2020 07:47 AM   Modules accepted: Orders

## 2020-11-27 ENCOUNTER — Other Ambulatory Visit (HOSPITAL_COMMUNITY): Payer: Self-pay

## 2020-11-30 ENCOUNTER — Telehealth (HOSPITAL_BASED_OUTPATIENT_CLINIC_OR_DEPARTMENT_OTHER): Payer: Self-pay

## 2020-11-30 NOTE — Telephone Encounter (Signed)
Per DPR left a voicemail for patient to schedule a lab appointment.   Results reviewed by patient via [facility].  Seen on 11/26/2020  6:28 AM Instructed patient to contact the office with any questions or concerns.

## 2020-11-30 NOTE — Telephone Encounter (Signed)
-----   Message from Orma Render, NP sent at 11/23/2020  7:47 AM EDT ----- Cholesterol elevated: Recommend rosuvastatin 10mg  at bedtime.  Vitamin D deficiency: Recommend VitD3 50,000iU once weekly x 8weeks. Pre-DM: Recommend Ozempic once weekly, if insurance will cover.   Recheck labs in 12 weeks: CMP, Lipid, VitD- please schedule lab appt.  Labs have been ordered.

## 2020-12-01 ENCOUNTER — Other Ambulatory Visit (HOSPITAL_COMMUNITY): Payer: Self-pay

## 2020-12-11 ENCOUNTER — Other Ambulatory Visit (HOSPITAL_COMMUNITY): Payer: Self-pay

## 2020-12-14 ENCOUNTER — Other Ambulatory Visit (HOSPITAL_COMMUNITY): Payer: Self-pay

## 2020-12-14 ENCOUNTER — Other Ambulatory Visit: Payer: Self-pay | Admitting: Physician Assistant

## 2020-12-15 ENCOUNTER — Other Ambulatory Visit (HOSPITAL_COMMUNITY): Payer: Self-pay

## 2020-12-15 MED ORDER — ALPRAZOLAM 0.5 MG PO TABS
0.5000 mg | ORAL_TABLET | Freq: Two times a day (BID) | ORAL | 0 refills | Status: DC | PRN
  Filled 2020-12-15: qty 60, 30d supply, fill #0
  Filled 2021-04-14: qty 60, 30d supply, fill #1

## 2020-12-15 NOTE — Telephone Encounter (Signed)
Last filled 06/09/20

## 2020-12-16 ENCOUNTER — Other Ambulatory Visit (HOSPITAL_COMMUNITY): Payer: Self-pay

## 2021-01-19 ENCOUNTER — Other Ambulatory Visit (HOSPITAL_BASED_OUTPATIENT_CLINIC_OR_DEPARTMENT_OTHER): Payer: Self-pay | Admitting: Obstetrics & Gynecology

## 2021-01-20 ENCOUNTER — Other Ambulatory Visit (HOSPITAL_BASED_OUTPATIENT_CLINIC_OR_DEPARTMENT_OTHER): Payer: Self-pay | Admitting: Nurse Practitioner

## 2021-01-20 ENCOUNTER — Other Ambulatory Visit (HOSPITAL_COMMUNITY): Payer: Self-pay

## 2021-01-20 MED ORDER — HYDROCHLOROTHIAZIDE 25 MG PO TABS
25.0000 mg | ORAL_TABLET | Freq: Every day | ORAL | 1 refills | Status: DC
  Filled 2021-01-20: qty 30, 30d supply, fill #0

## 2021-02-04 ENCOUNTER — Other Ambulatory Visit (HOSPITAL_COMMUNITY): Payer: Self-pay

## 2021-02-04 ENCOUNTER — Other Ambulatory Visit: Payer: Self-pay

## 2021-02-04 ENCOUNTER — Ambulatory Visit: Payer: 59 | Admitting: Physician Assistant

## 2021-02-04 ENCOUNTER — Encounter: Payer: Self-pay | Admitting: Physician Assistant

## 2021-02-04 DIAGNOSIS — G4700 Insomnia, unspecified: Secondary | ICD-10-CM

## 2021-02-04 DIAGNOSIS — F411 Generalized anxiety disorder: Secondary | ICD-10-CM

## 2021-02-04 DIAGNOSIS — F3342 Major depressive disorder, recurrent, in full remission: Secondary | ICD-10-CM | POA: Diagnosis not present

## 2021-02-04 MED ORDER — ESCITALOPRAM OXALATE 10 MG PO TABS
10.0000 mg | ORAL_TABLET | Freq: Every day | ORAL | 3 refills | Status: DC
  Filled 2021-02-04 – 2021-02-23 (×2): qty 30, 30d supply, fill #0
  Filled 2021-03-26: qty 30, 30d supply, fill #1
  Filled 2021-04-26: qty 30, 30d supply, fill #2
  Filled 2021-05-25: qty 30, 30d supply, fill #3

## 2021-02-04 NOTE — Progress Notes (Signed)
[facility] Check  Patient ID: Judy Foster,  MRN: [id number]  PCP: Orma Render, NP  Date of Evaluation: 02/04/2021 Time spent:20 minutes  Chief Complaint:  Chief Complaint   Anxiety; Depression; Follow-up     HISTORY/CURRENT STATUS: HPI for routine med check.  Doing well with the Lexapro. A few weeks ago, she felt overwhelmed, just had a lot going on with grad school, made her wonder if the Lexapro was still working well or not.  But she is able to see the light at the end of the tunnel, will graduate with her doctorate in May and has felt better mentally just with that realization.   Patient denies loss of interest in usual activities and is able to enjoy things.  Denies decreased energy or motivation.  Appetite has not changed.  No extreme sadness, tearfulness, or feelings of hopelessness.  Denies any changes in concentration, making decisions or remembering things.  Denies suicidal or homicidal thoughts.  Patient denies increased energy with decreased need for sleep, no increased talkativeness, no racing thoughts, no impulsivity or risky behaviors, no increased spending, no increased libido, no grandiosity, no increased irritability or anger, and no hallucinations.  She does have trouble sleeping still, uses either the Xanax, melatonin, or hydroxyzine.  She does not mix the Xanax with hydroxyzine.  She has trouble falling asleep more so than staying asleep if she does not take something.  Not having panic attacks now.  Does have generalized anxiety at times as noted before when she felt overwhelmed but it is not too often now.  Denies dizziness, syncope, seizures, numbness, tingling, tremor, tics, unsteady gait, slurred speech, confusion. Denies muscle or joint pain, stiffness, or dystonia.  Individual Medical History/ Review of Systems: Changes? :Yes    Dx with HTN, Increased Chol, Pre-DM Had D and C   Past medications for mental health diagnoses include: Prozac  caused drowiness, Wellbutrin, Zoloft seemed to work for [employer], trazodone, Melatonin, Viibryd caused headaches, Lexapro, Xanax   Allergies: Penicillins  Current Medications:  Current Outpatient Medications:    ALPRAZolam (XANAX) 0.5 MG tablet, Take 1 tablet (0.5 mg total) by mouth 2 (two) times daily as needed for anxiety., Disp: 180 tablet, Rfl: 0   hydrochlorothiazide (HYDRODIURIL) 25 MG tablet, Take 1 tablet (25 mg total) by mouth daily., Disp: 30 tablet, Rfl: 1   hydrOXYzine (ATARAX/VISTARIL) 25 MG tablet, Take 1 tablet (25 mg total) by mouth at bedtime for sleep & anxiety.  May repeat dose one time if needed., Disp: 60 tablet, Rfl: 11   Magnesium 500 MG TABS, Take 1,000 mg by mouth at bedtime., Disp: , Rfl:    phentermine 37.5 MG capsule, Take 1 capsule by mouth once a day in the morning., Disp: 30 capsule, Rfl: 2   Probiotic Product (PROBIOTIC DAILY PO), Take 1 capsule by mouth at bedtime., Disp: , Rfl:    rosuvastatin (CRESTOR) 10 MG tablet, Take 1 tablet (10 mg total) by mouth at bedtime., Disp: 90 tablet, Rfl: 3   escitalopram (LEXAPRO) 10 MG tablet, Take 1 tablet (10 mg total) by mouth daily., Disp: 90 tablet, Rfl: 3   Semaglutide,0.25 or 0.5MG /DOS, (OZEMPIC, 0.25 OR 0.5 MG/DOSE,) 2 MG/1.5ML SOPN, Inject 0.25 mg into the skin once a week for 14 days, THEN 0.5 mg once a week. With refill, start with 0.5mg  dose weekly. (Patient not taking: Reported on 02/04/2021), Disp: 4.5 mL, Rfl: 1   Vitamin D, Ergocalciferol, (DRISDOL) 1.25 MG (50000 UNIT) CAPS capsule, Take 1 capsule  by mouth every 7 days. Take for 8 total doses (Patient not taking: Reported on 02/04/2021), Disp: 8 capsule, Rfl: 0 Medication Side Effects: none  Family Medical/ Social History: Changes?   MENTAL HEALTH EXAM:  There were no vitals taken for this visit.There is no height or weight on file to calculate BMI.  General Appearance: Casual, Neat and Well Groomed  Eye Contact:  Good  Speech:  Clear and Coherent and Normal  Rate  Volume:  Normal  Mood:  Euthymic  Affect:  Appropriate  Thought Process:  Goal Directed and Descriptions of Associations: Circumstantial  Orientation:  Full (Time, Place, and Person)  Thought Content: Logical   Suicidal Thoughts:  No  Homicidal Thoughts:  No  Memory:  WNL  Judgement:  Good  Insight:  Good  Psychomotor Activity:  Normal  Concentration:  Concentration: Good and Attention Span: Good  Recall:  Good  Fund of Knowledge: Good  Language: Good  Assets:  Desire for Improvement  ADL's:  Intact  Cognition: WNL  Prognosis:  Good    DIAGNOSES:    ICD-10-CM   1. Recurrent major depressive disorder, in full remission ([place])  F33.42     2. Generalized anxiety disorder  F41.1     3. Insomnia, unspecified type  G47.00         Receiving Psychotherapy: No    RECOMMENDATIONS:  PDMP was reviewed.  Last Xanax filled 12/16/2020.  Phentermine filled 12/16/2020.   I provided 20 minutes of face to face time during this encounter, including time spent before and after the visit in records review, medical decision making, counseling pertinent to today's visit, and charting.  She is doing well with mental health medications so no changes will be made. Continue Xanax 0.5 mg, 1 p.o. twice daily as needed. Continue Lexapro 10 mg, 1 p.o. daily. Return in 4 months.  Donnal Moat, PA-C

## 2021-02-11 ENCOUNTER — Other Ambulatory Visit (HOSPITAL_COMMUNITY): Payer: Self-pay

## 2021-02-16 ENCOUNTER — Encounter: Payer: Self-pay | Admitting: Radiology

## 2021-02-18 ENCOUNTER — Ambulatory Visit (INDEPENDENT_AMBULATORY_CARE_PROVIDER_SITE_OTHER): Payer: 59 | Admitting: Nurse Practitioner

## 2021-02-18 ENCOUNTER — Other Ambulatory Visit: Payer: Self-pay

## 2021-02-18 ENCOUNTER — Encounter (HOSPITAL_BASED_OUTPATIENT_CLINIC_OR_DEPARTMENT_OTHER): Payer: Self-pay | Admitting: Nurse Practitioner

## 2021-02-18 ENCOUNTER — Other Ambulatory Visit (HOSPITAL_COMMUNITY): Payer: Self-pay

## 2021-02-18 VITALS — BP 117/72 | HR 72 | Ht 69.0 in | Wt 209.0 lb

## 2021-02-18 DIAGNOSIS — E7801 Familial hypercholesterolemia: Secondary | ICD-10-CM | POA: Diagnosis not present

## 2021-02-18 DIAGNOSIS — E559 Vitamin D deficiency, unspecified: Secondary | ICD-10-CM

## 2021-02-18 DIAGNOSIS — R7303 Prediabetes: Secondary | ICD-10-CM

## 2021-02-18 DIAGNOSIS — I10 Essential (primary) hypertension: Secondary | ICD-10-CM

## 2021-02-18 DIAGNOSIS — I493 Ventricular premature depolarization: Secondary | ICD-10-CM | POA: Insufficient documentation

## 2021-02-18 DIAGNOSIS — R635 Abnormal weight gain: Secondary | ICD-10-CM

## 2021-02-18 MED ORDER — OZEMPIC (0.25 OR 0.5 MG/DOSE) 2 MG/1.5ML ~~LOC~~ SOPN
PEN_INJECTOR | SUBCUTANEOUS | 1 refills | Status: DC
  Filled 2021-02-18: qty 1.5, 30d supply, fill #0
  Filled 2021-03-26: qty 1.5, 28d supply, fill #1
  Filled 2021-04-26: qty 1.5, 28d supply, fill #2

## 2021-02-18 MED ORDER — HYDROCHLOROTHIAZIDE 25 MG PO TABS
25.0000 mg | ORAL_TABLET | Freq: Every day | ORAL | 1 refills | Status: DC
  Filled 2021-02-18: qty 30, 30d supply, fill #0
  Filled 2021-03-26: qty 30, 30d supply, fill #1
  Filled 2021-04-26: qty 30, 30d supply, fill #2
  Filled 2021-05-25: qty 30, 30d supply, fill #3
  Filled 2021-06-25: qty 30, 30d supply, fill #4
  Filled 2021-07-26: qty 30, 30d supply, fill #5

## 2021-02-18 MED ORDER — ROSUVASTATIN CALCIUM 10 MG PO TABS
10.0000 mg | ORAL_TABLET | Freq: Every day | ORAL | 3 refills | Status: DC
  Filled 2021-02-18: qty 30, 30d supply, fill #0
  Filled 2021-03-26: qty 30, 30d supply, fill #1
  Filled 2021-04-26: qty 30, 30d supply, fill #2
  Filled 2021-05-25: qty 30, 30d supply, fill #3
  Filled 2021-06-25: qty 30, 30d supply, fill #4
  Filled 2021-07-26: qty 30, 30d supply, fill #5
  Filled 2021-08-23: qty 30, 30d supply, fill #6
  Filled 2021-09-27: qty 30, 30d supply, fill #7
  Filled 2021-10-26: qty 30, 30d supply, fill #8
  Filled 2021-11-25: qty 30, 30d supply, fill #9
  Filled 2021-12-20: qty 30, 30d supply, fill #10
  Filled 2022-01-23: qty 30, 30d supply, fill #11

## 2021-02-18 NOTE — Progress Notes (Signed)
Established Patient Office Visit  Subjective:  Patient ID: Judy Foster, female    DOB: 1975-03-13  Age: 45 y.o. MRN: [id number]  CC:  Chief Complaint  Patient presents with   Follow-up    Patient presents today for follow up weight loss and lipid recheck. She stopped taking the ozempic due to having COVID. She does need a refill on Crestor while in the office today.     HPI Judy Foster presents for follow-up on weight loss efforts and cholesterol.  She is working towards healthy weight for management of overall health and hypertension, prediabetes, and hyperlipidemia.  She has previously tried and failed multiple methods for weight management and was started on phentermine 37.5mg  daily to help with her metabolism in conjunction with diet and exercise. She was also started on semaglutide injections weekly for CV health, insulin resistance, and lipids.  Her initial weight on 11/19/2020 was: 214 lb  Her BMI was: 31.5% Today her weight is: 209 Today her BMI is: 30.86% She has had a weight loss of: 5 pounds  Her BP was 155/93 at an appointment on 10/13/2020 and she was started on medication for HTN. Her BP improved to 236/85 at her last visit.  On 11/19/2020  HgbA1c was 5.8% on 11/19/2020 Total cholesterol was 293 Triglycerides were 271 LDL cholesterol was 198 Her Chol/HDL Ratio was at 7.1% indicating a 2x average risk  Today she tells me she feels good overall.  She did stop the Ozempic after the first dose due to cough, which she felt was related to GERD, but then tested positive for COVID the following day, so she is unsure if this was the case or not. She did not restart the medication.  She was using phentermine for a period of time and felt that was helpful. She did notice her focus was significantly increased while on the medication.  She did have one episode of palpitations, but this was short lived and after drinking a large coffee. Otherwise, she tolerated the  medication well. She is not sure she wants to restart this today.  She is interested in restarting the Ozempic to try.   Outpatient Medications Prior to Visit  Medication Sig Dispense Refill   ALPRAZolam (XANAX) 0.5 MG tablet Take 1 tablet (0.5 mg total) by mouth 2 (two) times daily as needed for anxiety. 180 tablet 0   escitalopram (LEXAPRO) 10 MG tablet Take 1 tablet (10 mg total) by mouth daily. 90 tablet 3   hydrochlorothiazide (HYDRODIURIL) 25 MG tablet Take 1 tablet (25 mg total) by mouth daily. 30 tablet 1   hydrOXYzine (ATARAX/VISTARIL) 25 MG tablet Take 1 tablet (25 mg total) by mouth at bedtime for sleep & anxiety.  May repeat dose one time if needed. 60 tablet 11   Magnesium 500 MG TABS Take 1,000 mg by mouth at bedtime.     phentermine 37.5 MG capsule Take 1 capsule by mouth once a day in the morning. 30 capsule 2   Probiotic Product (PROBIOTIC DAILY PO) Take 1 capsule by mouth at bedtime.     rosuvastatin (CRESTOR) 10 MG tablet Take 1 tablet (10 mg total) by mouth at bedtime. 90 tablet 3   Semaglutide,0.25 or 0.5MG /DOS, (OZEMPIC, 0.25 OR 0.5 MG/DOSE,) 2 MG/1.5ML SOPN Inject 0.25 mg into the skin once a week for 14 days, THEN 0.5 mg once a week. With refill, start with 0.5mg  dose weekly. (Patient not taking: Reported on 02/04/2021) 4.5 mL 1   Vitamin D,  Ergocalciferol, (DRISDOL) 1.25 MG (50000 UNIT) CAPS capsule Take 1 capsule by mouth every 7 days. Take for 8 total doses (Patient not taking: Reported on 02/04/2021) 8 capsule 0   No facility-administered medications prior to visit.    Allergies  Allergen Reactions   Penicillins Rash    ROS Review of Systems All review of systems negative except what is listed in the HPI    Objective:    Physical Exam Vitals and nursing note reviewed.  Constitutional:      Appearance: Normal appearance.  HENT:     Head: Normocephalic.  Eyes:     Extraocular Movements: Extraocular movements intact.     Conjunctiva/sclera: Conjunctivae  normal.     Pupils: Pupils are equal, round, and reactive to light.  Cardiovascular:     Rate and Rhythm: Normal rate and regular rhythm.     Pulses: Normal pulses.     Heart sounds: Normal heart sounds.  Pulmonary:     Effort: Pulmonary effort is normal.     Breath sounds: Normal breath sounds.  Abdominal:     General: Abdomen is flat. Bowel sounds are normal. There is no distension.     Palpations: Abdomen is soft.     Tenderness: There is no abdominal tenderness.  Musculoskeletal:        General: Normal range of motion.     Cervical back: Normal range of motion.     Right lower leg: No edema.     Left lower leg: No edema.  Skin:    General: Skin is warm and dry.     Capillary Refill: Capillary refill takes less than 2 seconds.  Neurological:     General: No focal deficit present.     Mental Status: She is alert and oriented to person, place, and time.  Psychiatric:        Mood and Affect: Mood normal.        Behavior: Behavior normal.        Thought Content: Thought content normal.        Judgment: Judgment normal.    BP 117/72    Pulse 72    Ht 5\' 9"  (1.753 m)    Wt 209 lb (94.8 kg)    SpO2 94%    BMI 30.86 kg/m  Wt Readings from Last 3 Encounters:  02/18/21 209 lb (94.8 kg)  11/19/20 213 lb 6.4 oz (96.8 kg)  10/13/20 210 lb (95.3 kg)        Assessment & Plan:   Problem List Items Addressed This Visit     Prediabetes    Will obtain labs today for further evaluation.  Discussed restarting Ozempic for both blood sugar and CV benefit.  Education on use of acid reducer PRN if symptoms of GERD return.  If symptoms are severe or daily, she will let me know.       Relevant Medications   Semaglutide,0.25 or 0.5MG /DOS, (OZEMPIC, 0.25 OR 0.5 MG/DOSE,) 2 MG/1.5ML SOPN   Other Relevant Orders   Lipid panel   VITAMIN D 25 Hydroxy (Vit-D Deficiency, Fractures)   Comprehensive metabolic panel   Weight gain    Working on weight loss and doing well.  She has had a 5 lb  weight loss in the last 3 months.  Discussed restarting Ozempic as I do feel this would be incredibly beneficial for her.  She is in agreement to this.  We will plan to follow-up in 3 months with virtual call to see how  she is doing Labs today and plan additional labs in 6 months is she is stable based on the findings today.       Relevant Medications   Semaglutide,0.25 or 0.5MG /DOS, (OZEMPIC, 0.25 OR 0.5 MG/DOSE,) 2 MG/1.5ML SOPN   Other Relevant Orders   Lipid panel   VITAMIN D 25 Hydroxy (Vit-D Deficiency, Fractures)   Comprehensive metabolic panel   Familial hypercholesterolemia    Will check labs today Doing well on crestor with no side effects.  Continue crestor and restart Ozempic for CV benefit.       Relevant Medications   Semaglutide,0.25 or 0.5MG /DOS, (OZEMPIC, 0.25 OR 0.5 MG/DOSE,) 2 MG/1.5ML SOPN   Other Relevant Orders   Lipid panel   VITAMIN D 25 Hydroxy (Vit-D Deficiency, Fractures)   Comprehensive metabolic panel   Primary hypertension    Blood pressure very well controlled today.  No alarm symptoms present.  Tolerating medication well.  Will obtain labs today for evaluation.       Relevant Medications   Semaglutide,0.25 or 0.5MG /DOS, (OZEMPIC, 0.25 OR 0.5 MG/DOSE,) 2 MG/1.5ML SOPN   Vitamin D deficiency - Primary    Will recheck labs today. She has completed replacement.  Will determine if additional treatment is needed based on results.       Relevant Orders   Lipid panel   VITAMIN D 25 Hydroxy (Vit-D Deficiency, Fractures)   Comprehensive metabolic panel    Meds ordered this encounter  Medications   Semaglutide,0.25 or 0.5MG /DOS, (OZEMPIC, 0.25 OR 0.5 MG/DOSE,) 2 MG/1.5ML SOPN    Sig: Inject 0.25 mg into the skin once a week for 14 days, THEN 0.5 mg once a week. With refill, start with 0.5mg  dose weekly.    Dispense:  4.5 mL    Refill:  1    Patient will have coupon: $25 three month supply    Follow-up: Return in about 3 months (around  05/19/2021) for VV- weight, 6 months in person and labs.    Orma Render, NP

## 2021-02-18 NOTE — Assessment & Plan Note (Signed)
Working on weight loss and doing well.  She has had a 5 lb weight loss in the last 3 months.  Discussed restarting Ozempic as I do feel this would be incredibly beneficial for her.  She is in agreement to this.  We will plan to follow-up in 3 months with virtual call to see how she is doing Labs today and plan additional labs in 6 months is she is stable based on the findings today.

## 2021-02-18 NOTE — Assessment & Plan Note (Signed)
Will check labs today Doing well on crestor with no side effects.  Continue crestor and restart Ozempic for CV benefit.

## 2021-02-18 NOTE — Patient Instructions (Signed)
Happy New Year! I pray that 2023 fills your life with many blessings and health!  We will get labs today to see where you stand with your overall numbers.    Plan to restart the Ozempic and let me know if you are not able to tolerate it and we can try the phentermine again.   Let's plan to touch base in 3 months with a phone visit.  We will do labs again in 6 months unless something is grossly off today and I will let you know.

## 2021-02-18 NOTE — Assessment & Plan Note (Signed)
Will recheck labs today. She has completed replacement.  Will determine if additional treatment is needed based on results.

## 2021-02-18 NOTE — Assessment & Plan Note (Signed)
Will obtain labs today for further evaluation.  Discussed restarting Ozempic for both blood sugar and CV benefit.  Education on use of acid reducer PRN if symptoms of GERD return.  If symptoms are severe or daily, she will let me know.

## 2021-02-18 NOTE — Assessment & Plan Note (Signed)
Blood pressure very well controlled today.  No alarm symptoms present.  Tolerating medication well.  Will obtain labs today for evaluation.

## 2021-02-19 ENCOUNTER — Other Ambulatory Visit (HOSPITAL_COMMUNITY): Payer: Self-pay

## 2021-02-19 LAB — LIPID PANEL
Chol/HDL Ratio: 4.6 ratio — ABNORMAL HIGH (ref 0.0–4.4)
Cholesterol, Total: 202 mg/dL — ABNORMAL HIGH (ref 100–199)
HDL: 44 mg/dL (ref >39)
LDL Chol Calc (NIH): 127 mg/dL — ABNORMAL HIGH (ref 0–99)
Triglycerides: 173 mg/dL — ABNORMAL HIGH (ref 0–149)
VLDL Cholesterol Cal: 31 mg/dL (ref 5–40)

## 2021-02-19 LAB — COMPREHENSIVE METABOLIC PANEL
ALT: 39 IU/L — ABNORMAL HIGH (ref 0–32)
AST: 36 IU/L (ref 0–40)
Albumin/Globulin Ratio: 2 (ref 1.2–2.2)
Albumin: 4.7 g/dL (ref 3.8–4.8)
Alkaline Phosphatase: 73 IU/L (ref 44–121)
BUN/Creatinine Ratio: 13 (ref 9–23)
BUN: 11 mg/dL (ref 6–24)
Bilirubin Total: 0.4 mg/dL (ref 0.0–1.2)
CO2: 27 mmol/L (ref 20–29)
Calcium: 9.4 mg/dL (ref 8.7–10.2)
Chloride: 98 mmol/L (ref 96–106)
Creatinine, Ser: 0.84 mg/dL (ref 0.57–1.00)
Globulin, Total: 2.4 g/dL (ref 1.5–4.5)
Glucose: 109 mg/dL — ABNORMAL HIGH (ref 70–99)
Potassium: 3.7 mmol/L (ref 3.5–5.2)
Sodium: 138 mmol/L (ref 134–144)
Total Protein: 7.1 g/dL (ref 6.0–8.5)
eGFR: 88 mL/min/{1.73_m2} (ref >59)

## 2021-02-19 LAB — VITAMIN D 25 HYDROXY (VIT D DEFICIENCY, FRACTURES): Vit D, 25-Hydroxy: 30 ng/mL (ref 30.0–100.0)

## 2021-02-23 ENCOUNTER — Other Ambulatory Visit (HOSPITAL_COMMUNITY): Payer: Self-pay

## 2021-03-26 ENCOUNTER — Other Ambulatory Visit (HOSPITAL_COMMUNITY): Payer: Self-pay

## 2021-04-06 ENCOUNTER — Other Ambulatory Visit (HOSPITAL_COMMUNITY): Payer: Self-pay

## 2021-04-06 ENCOUNTER — Other Ambulatory Visit (HOSPITAL_BASED_OUTPATIENT_CLINIC_OR_DEPARTMENT_OTHER): Payer: Self-pay | Admitting: Nurse Practitioner

## 2021-04-06 DIAGNOSIS — H6690 Otitis media, unspecified, unspecified ear: Secondary | ICD-10-CM

## 2021-04-06 MED ORDER — DOXYCYCLINE HYCLATE 100 MG PO TABS
100.0000 mg | ORAL_TABLET | Freq: Two times a day (BID) | ORAL | 0 refills | Status: DC
Start: 2021-04-06 — End: 2021-05-19
  Filled 2021-04-06: qty 14, 7d supply, fill #0

## 2021-04-15 ENCOUNTER — Other Ambulatory Visit (HOSPITAL_COMMUNITY): Payer: Self-pay

## 2021-04-26 ENCOUNTER — Other Ambulatory Visit (HOSPITAL_COMMUNITY): Payer: Self-pay

## 2021-04-29 ENCOUNTER — Other Ambulatory Visit: Payer: Self-pay | Admitting: Nurse Practitioner

## 2021-04-29 DIAGNOSIS — Z1231 Encounter for screening mammogram for malignant neoplasm of breast: Secondary | ICD-10-CM

## 2021-05-05 ENCOUNTER — Other Ambulatory Visit: Payer: Self-pay

## 2021-05-05 ENCOUNTER — Ambulatory Visit
Admission: RE | Admit: 2021-05-05 | Discharge: 2021-05-05 | Disposition: A | Discharge status: Home / Self Care | Payer: 59 | Source: Ambulatory Visit | Attending: Nurse Practitioner | Admitting: Nurse Practitioner

## 2021-05-05 DIAGNOSIS — Z1231 Encounter for screening mammogram for malignant neoplasm of breast: Secondary | ICD-10-CM

## 2021-05-19 ENCOUNTER — Other Ambulatory Visit (HOSPITAL_COMMUNITY): Payer: Self-pay

## 2021-05-19 ENCOUNTER — Encounter (HOSPITAL_BASED_OUTPATIENT_CLINIC_OR_DEPARTMENT_OTHER): Payer: Self-pay | Admitting: Nurse Practitioner

## 2021-05-19 ENCOUNTER — Ambulatory Visit (INDEPENDENT_AMBULATORY_CARE_PROVIDER_SITE_OTHER): Payer: 59 | Admitting: Nurse Practitioner

## 2021-05-19 ENCOUNTER — Other Ambulatory Visit: Payer: Self-pay

## 2021-05-19 VITALS — Wt 201.0 lb

## 2021-05-19 DIAGNOSIS — E7801 Familial hypercholesterolemia: Secondary | ICD-10-CM

## 2021-05-19 DIAGNOSIS — R635 Abnormal weight gain: Secondary | ICD-10-CM

## 2021-05-19 DIAGNOSIS — I10 Essential (primary) hypertension: Secondary | ICD-10-CM

## 2021-05-19 DIAGNOSIS — R7303 Prediabetes: Secondary | ICD-10-CM | POA: Diagnosis not present

## 2021-05-19 MED ORDER — SEMAGLUTIDE (1 MG/DOSE) 4 MG/3ML ~~LOC~~ SOPN
1.0000 mg | PEN_INJECTOR | SUBCUTANEOUS | 3 refills | Status: DC
  Filled 2021-05-19: qty 3, 28d supply, fill #0
  Filled 2021-06-25: qty 3, 28d supply, fill #1
  Filled 2021-07-26: qty 3, 28d supply, fill #2
  Filled 2021-08-23: qty 3, 28d supply, fill #3
  Filled 2021-09-16: qty 3, 28d supply, fill #4

## 2021-05-19 NOTE — Patient Instructions (Signed)
We will increase the Ozempic to '1mg'$  a week. I have sent the new prescription to the pharmacy with a request to provide 3 months supply for you at a time.  ? ?Let me know if you have any side effects or concerns with the increased dose.  ? ?Good luck in your last semester!!! You are in the home stretch! ?

## 2021-05-19 NOTE — Progress Notes (Signed)
Virtual Visit Encounter telephone visit. ? ? ?I connected with  Judy Foster on 05/19/21 at  8:10 AM EDT by secure audio telemedicine application. I verified that I am speaking with the correct person using two identifiers. ?  ?I introduced myself as a Designer, jewellery with the practice. The limitations of evaluation and management by telemedicine discussed with the patient and the availability of in person appointments. The patient expressed verbal understanding and consent to proceed. ? ?Participating parties in this visit include: Myself and patient ? ?The patient is: Patient Location: Home ?I am: Provider Location: Office/Clinic ?Subjective:   ? ?CC and HPI: Judy Foster is a 46 y.o. year old female presenting for follow up of weight management.  ? ?She weighed herself at 201 lb this morning with 5-6 pound weight loss so far.  ?She is in school right now for NP and has been exceptionally stressed and had an unusual schedule recently.  ?She has been jogging a couple days a week and trying to eat smaller meals, but endorses that it has been difficult to manage her meals as well as she would like due to scheduling and time constraints. She has finished her clinical portion of school and feels that the next few months will be reasonable to manage a healthier meal plan and increase her exercise.  ?She is not having any side effects of the medication and is tolerating well. No nausea, vomiting, abdominal pain. Occasional GERD symptoms present, but nothing she cannot manage with OTC treatment.  ?She is interested in increasing the dose to see if she can manage additional weight loss.  ? ?Past medical history, Surgical history, Family history not pertinant except as noted below, Social history, Allergies, and medications have been entered into the medical record, reviewed, and corrections made.  ? ?Review of Systems:  ?All review of systems negative except what is listed in the HPI ? ?Objective:   ? ?Alert and  oriented x 4 ?Speaking in clear sentences with no shortness of breath. ?No distress. ? ?Impression and Recommendations:   ? ?Problem List Items Addressed This Visit   ? ? Prediabetes - Primary  ? Relevant Medications  ? Semaglutide, 1 MG/DOSE, 4 MG/3ML SOPN  ? Weight gain  ? Relevant Medications  ? Semaglutide, 1 MG/DOSE, 4 MG/3ML SOPN  ? Familial hypercholesterolemia  ? Relevant Medications  ? Semaglutide, 1 MG/DOSE, 4 MG/3ML SOPN  ? Primary hypertension  ? Relevant Medications  ? Semaglutide, 1 MG/DOSE, 4 MG/3ML SOPN  ?Doing well on ozempic 0.'5mg'$ . Weight loss has not been as substantial as she hoped, but there have been life events and stressors that have limited her recently. We will increase the dose to '1mg'$  weekly and continue to monitor. She is aware if she has side effects that she can reach out to me to reduce the dosage, if needed. She will continue to increase her activity levels and work on dietary changes to facilitate healthier weight management. We will plan to follow-up in a few months- she has an appointment already scheduled.  ? ?orders and follow up as documented in EMR ?I discussed the assessment and treatment plan with the patient. The patient was provided an opportunity to ask questions and all were answered. The patient agreed with the plan and demonstrated an understanding of the instructions. ?  ?The patient was advised to call back or seek an in-person evaluation if the symptoms worsen or if the condition fails to improve as anticipated. ? ?Follow-Up: scheduled  for a few months already ? ?I provided 16 minutes of non-face-to-face interaction with this non face-to-face encounter including intake, same-day documentation, and chart review.  ? ?Orma Render, NP , DNP, AGNP-c ?[facility] ?Primary Care & Sports Medicine at [facility] ?[phone] ?[phone] (fax) ? ?

## 2021-05-25 ENCOUNTER — Other Ambulatory Visit (HOSPITAL_COMMUNITY): Payer: Self-pay

## 2021-05-29 ENCOUNTER — Telehealth: Payer: 59 | Admitting: Nurse Practitioner

## 2021-05-29 DIAGNOSIS — J0100 Acute maxillary sinusitis, unspecified: Secondary | ICD-10-CM

## 2021-05-29 MED ORDER — DOXYCYCLINE HYCLATE 100 MG PO TABS: 100.0000 mg | ORAL_TABLET | Freq: Two times a day (BID) | ORAL | 0 refills | Status: DC

## 2021-05-29 NOTE — Progress Notes (Signed)

## 2021-06-07 ENCOUNTER — Encounter: Payer: Self-pay | Admitting: Physician Assistant

## 2021-06-07 ENCOUNTER — Ambulatory Visit: Payer: 59 | Admitting: Physician Assistant

## 2021-06-07 ENCOUNTER — Other Ambulatory Visit (HOSPITAL_COMMUNITY): Payer: Self-pay

## 2021-06-07 DIAGNOSIS — F331 Major depressive disorder, recurrent, moderate: Secondary | ICD-10-CM | POA: Diagnosis not present

## 2021-06-07 DIAGNOSIS — G4700 Insomnia, unspecified: Secondary | ICD-10-CM | POA: Diagnosis not present

## 2021-06-07 DIAGNOSIS — F411 Generalized anxiety disorder: Secondary | ICD-10-CM | POA: Diagnosis not present

## 2021-06-07 MED ORDER — ESCITALOPRAM OXALATE 10 MG PO TABS
15.0000 mg | ORAL_TABLET | Freq: Every day | ORAL | 3 refills | Status: DC
  Filled 2021-06-07: qty 47, 31d supply, fill #0
  Filled 2021-06-19: qty 45, 30d supply, fill #0

## 2021-06-07 NOTE — Progress Notes (Signed)
[facility] ? ?Patient ID: Judy Foster,  ?MRN: [id number] ? ?PCP: Orma Render, NP ? ?Date of Evaluation: 06/07/2021 ?Time spent:20 minutes ? ?Chief Complaint:  ?Chief Complaint   ?Anxiety; Depression; Follow-up ?  ? ? ?HISTORY/CURRENT STATUS: ?HPI for routine med check. ? ?Lexapro isn't working as well as it did.  For the past 2 months she has felt "blah."  Wants to sleep a lot. But then has trouble going to sleep and staying asleep. Pushes herself to do things, has to get up to get the kids to school.  After that she is able to do the necessary things around the house.  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  The past month or so has been very busy with that.  Has found herself not to be so excited as she thinks she ought to be though.  Personal hygiene is normal.  Does have anxiety at times, when she takes the Xanax in the evening it does help her fall asleep and stay asleep but if she does not take it then she can't.  Does not take naps.  No panic attacks.  Does not cry easily.  Does isolate, cancels plans that she has made.  No suicidal or homicidal thoughts. ? ?Denies dizziness, syncope, seizures, numbness, tingling, tremor, tics, unsteady gait, slurred speech, confusion. Denies muscle or joint pain, stiffness, or dystonia. ? ?Individual Medical History/ Review of Systems: Changes? :No    ? ? ?Past medications for mental health diagnoses include: ?Prozac caused drowiness, Wellbutrin, Zoloft seemed to work for [employer], trazodone, Melatonin, Viibryd caused headaches, Lexapro, Xanax  ? ?Allergies: Penicillins ? ?Current Medications:  ?Current Outpatient Medications:  ?  ALPRAZolam (XANAX) 0.5 MG tablet, Take 1 tablet (0.5 mg total) by mouth 2 (two) times daily as needed for anxiety., Disp: 180 tablet, Rfl: 0 ?  cholecalciferol (VITAMIN D3) 25 MCG (1000 UNIT) tablet, Take 1,000 Units by mouth daily., Disp: , Rfl:  ?  doxycycline (VIBRA-TABS) 100 MG tablet, Take 1  tablet (100 mg total) by mouth 2 (two) times daily. 1 po bid, Disp: 20 tablet, Rfl: 0 ?  hydrochlorothiazide (HYDRODIURIL) 25 MG tablet, Take 1 tablet by mouth daily., Disp: 90 tablet, Rfl: 1 ?  Magnesium 500 MG TABS, Take 1,000 mg by mouth at bedtime., Disp: , Rfl:  ?  Probiotic Product (PROBIOTIC DAILY PO), Take 1 capsule by mouth at bedtime., Disp: , Rfl:  ?  rosuvastatin (CRESTOR) 10 MG tablet, Take 1 tablet by mouth at bedtime., Disp: 90 tablet, Rfl: 3 ?  Semaglutide, 1 MG/DOSE, 4 MG/3ML SOPN, Inject 1 mg as directed once a week., Disp: 9 mL, Rfl: 3 ?  escitalopram (LEXAPRO) 10 MG tablet, Take 1 and 1/2 tablets by mouth daily., Disp: 135 tablet, Rfl: 3 ?  hydrOXYzine (ATARAX/VISTARIL) 25 MG tablet, Take 1 tablet (25 mg total) by mouth at bedtime for sleep & anxiety.  May repeat dose one time if needed. (Patient not taking: Reported on 06/07/2021), Disp: 60 tablet, Rfl: 11 ?Medication Side Effects: none ? ?Family Medical/ Social History: Changes?  Graduates with doctorate as a Designer, jewellery in 2 weeks. ? ?MENTAL HEALTH EXAM: ? ?There were no vitals taken for this visit.There is no height or weight on file to calculate BMI.  ?General Appearance: Casual, Neat and Well Groomed  ?Eye Contact:  Good  ?Speech:  Clear and Coherent and Normal Rate  ?Volume:  Normal  ?Mood:  Depressed  ?Affect:  Depressed  ?Thought Process:  Goal Directed and Descriptions of Associations: Circumstantial  ?Orientation:  Full (Time, Place, and Person)  ?Thought Content: Logical   ?Suicidal Thoughts:  No  ?Homicidal Thoughts:  No  ?Memory:  WNL  ?Judgement:  Good  ?Insight:  Good  ?Psychomotor Activity:  Normal  ?Concentration:  Concentration: Good and Attention Span: Good  ?Recall:  Good  ?Fund of Knowledge: Good  ?Language: Good  ?Assets:  Desire for Improvement  ?ADL's:  Intact  ?Cognition: WNL  ?Prognosis:  Good  ? ? ?DIAGNOSES:  ?  ICD-10-CM   ?1. Major depressive disorder, recurrent episode, moderate (HCC)  F33.1   ?  ?2.  Generalized anxiety disorder  F41.1   ?  ?3. Insomnia, unspecified type  G47.00   ?  ? ? ?Receiving Psychotherapy: No  ? ? ?RECOMMENDATIONS:  ?PDMP was reviewed.  Last Xanax filled 04/15/2021.  Phentermine filled 12/16/2020. ?I provided 20 minutes of face to face time during this encounter, including time spent before and after the visit in records review, medical decision making, counseling pertinent to today's visit, and charting.  ?Recommend increasing the Lexapro.  Also encouraged her to take the Xanax at night more frequently so she can get good rest, especially over the next few weeks to a month while she is going through graduation and then hopefully taking her boards in May. ? ?Continue Xanax 0.5 mg, 1 p.o. twice daily as needed. ?Increase Lexapro 10 mg to 1.5 pills daily. ?Return in 6 weeks. ? ?Donnal Moat, PA-C  ?

## 2021-06-09 ENCOUNTER — Other Ambulatory Visit (HOSPITAL_COMMUNITY): Payer: Self-pay

## 2021-06-16 ENCOUNTER — Telehealth (HOSPITAL_BASED_OUTPATIENT_CLINIC_OR_DEPARTMENT_OTHER): Payer: Self-pay | Admitting: Obstetrics & Gynecology

## 2021-06-16 NOTE — Telephone Encounter (Signed)
Called patient and left a message to call the office back to schedule the appointment .  

## 2021-06-18 ENCOUNTER — Other Ambulatory Visit (HOSPITAL_COMMUNITY): Payer: Self-pay

## 2021-06-19 ENCOUNTER — Other Ambulatory Visit (HOSPITAL_COMMUNITY): Payer: Self-pay

## 2021-06-25 ENCOUNTER — Other Ambulatory Visit (HOSPITAL_COMMUNITY): Payer: Self-pay

## 2021-06-25 ENCOUNTER — Other Ambulatory Visit: Payer: Self-pay | Admitting: Physician Assistant

## 2021-06-26 ENCOUNTER — Other Ambulatory Visit (HOSPITAL_COMMUNITY): Payer: Self-pay

## 2021-06-28 ENCOUNTER — Other Ambulatory Visit (HOSPITAL_COMMUNITY): Payer: Self-pay

## 2021-06-28 MED ORDER — ALPRAZOLAM 0.5 MG PO TABS
0.5000 mg | ORAL_TABLET | Freq: Two times a day (BID) | ORAL | 0 refills | Status: DC | PRN
  Filled 2021-06-28: qty 60, 30d supply, fill #0
  Filled 2021-08-23: qty 60, 30d supply, fill #1

## 2021-07-21 ENCOUNTER — Encounter: Payer: Self-pay | Admitting: Physician Assistant

## 2021-07-21 ENCOUNTER — Ambulatory Visit: Payer: 59 | Admitting: Physician Assistant

## 2021-07-21 DIAGNOSIS — G4700 Insomnia, unspecified: Secondary | ICD-10-CM | POA: Diagnosis not present

## 2021-07-21 DIAGNOSIS — F3341 Major depressive disorder, recurrent, in partial remission: Secondary | ICD-10-CM

## 2021-07-21 MED ORDER — ESCITALOPRAM OXALATE 10 MG PO TABS: 10.0000 mg | ORAL_TABLET | Freq: Every day | ORAL | 3 refills | Status: DC

## 2021-07-21 NOTE — Progress Notes (Signed)
[facility]  Patient ID: Judy Foster,  MRN: [id number]  PCP: Orma Render, NP  Date of Evaluation: 07/21/2021 Time spent:20 minutes  Chief Complaint:  Chief Complaint   Anxiety; Depression; Follow-up     HISTORY/CURRENT STATUS: HPI for routine med check.  She decreased Lexapro after about 3 weeks.  She was extremely tired for that entire time, not sure if the increase Lexapro was the cause because she was also finishing up grad school so she had a lot on her plate.  At any rate she decreased the Lexapro back to 10 mg and the fatigue has lessened.  States she feels better and isn't depressed right now. Energy and motivation are good. Is able to enjoy things.  Work is going well, she is looking for an NP job.  ADLs and personal hygiene are normal.  Appetite has not changed.  Weight is stable.  No extreme sadness, tearfulness, or feelings of hopelessness.  She takes a Xanax every night to help her sleep.  If she does not, she will lay awake for sometimes 2 to 3 hours.  Never takes the Xanax during the day.  Not having panic attacks.  Denies any changes in concentration, making decisions or remembering things.  Denies suicidal or homicidal thoughts.  Patient denies increased energy with decreased need for sleep, no increased talkativeness, no racing thoughts, no impulsivity or risky behaviors, no increased spending, no increased libido, no grandiosity, no increased irritability or anger, and no hallucinations.  Denies dizziness, syncope, seizures, numbness, tingling, tremor, tics, unsteady gait, slurred speech, confusion. Denies muscle or joint pain, stiffness, or dystonia.  Individual Medical History/ Review of Systems: Changes? :No      Past medications for mental health diagnoses include: Prozac caused drowiness, Wellbutrin, Zoloft seemed to work for [employer], trazodone, Melatonin, Viibryd caused headaches, Lexapro, Xanax, Hydroxyzine for   Allergies:  Penicillins  Current Medications:  Current Outpatient Medications:    ALPRAZolam (XANAX) 0.5 MG tablet, Take 1 tablet by mouth 2  times daily as needed for anxiety., Disp: 180 tablet, Rfl: 0   cholecalciferol (VITAMIN D3) 25 MCG (1000 UNIT) tablet, Take 1,000 Units by mouth daily., Disp: , Rfl:    hydrochlorothiazide (HYDRODIURIL) 25 MG tablet, Take 1 tablet by mouth daily., Disp: 90 tablet, Rfl: 1   Magnesium 500 MG TABS, Take 1,000 mg by mouth at bedtime., Disp: , Rfl:    Probiotic Product (PROBIOTIC DAILY PO), Take 1 capsule by mouth at bedtime., Disp: , Rfl:    rosuvastatin (CRESTOR) 10 MG tablet, Take 1 tablet by mouth at bedtime., Disp: 90 tablet, Rfl: 3   Semaglutide, 1 MG/DOSE, 4 MG/3ML SOPN, Inject 1 mg as directed once a week., Disp: 9 mL, Rfl: 3   doxycycline (VIBRA-TABS) 100 MG tablet, Take 1 tablet (100 mg total) by mouth 2 (two) times daily. 1 po bid, Disp: 20 tablet, Rfl: 0   escitalopram (LEXAPRO) 10 MG tablet, Take 1 tablet (10 mg total) by mouth daily., Disp: 90 tablet, Rfl: 3   hydrOXYzine (ATARAX/VISTARIL) 25 MG tablet, Take 1 tablet (25 mg total) by mouth at bedtime for sleep & anxiety.  May repeat dose one time if needed. (Patient not taking: Reported on 06/07/2021), Disp: 60 tablet, Rfl: 11 Medication Side Effects: none  Family Medical/ Social History: Changes?  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   MENTAL HEALTH EXAM:  There were no vitals taken for this visit.There is no height or weight on file to calculate  BMI.  General Appearance: Casual, Neat and Well Groomed  Eye Contact:  Good  Speech:  Clear and Coherent and Normal Rate  Volume:  Normal  Mood:  Euthymic  Affect:  Congruent  Thought Process:  Goal Directed and Descriptions of Associations: Circumstantial  Orientation:  Full (Time, Place, and Person)  Thought Content: Logical   Suicidal Thoughts:  No  Homicidal Thoughts:  No  Memory:  WNL  Judgement:  Good  Insight:  Good  Psychomotor Activity:   Normal  Concentration:  Concentration: Good and Attention Span: Good  Recall:  Good  Fund of Knowledge: Good  Language: Good  Assets:  Desire for Improvement  ADL's:  Intact  Cognition: WNL  Prognosis:  Good    DIAGNOSES:    ICD-10-CM   1. Depression, major, recurrent, in partial remission ([place])  F33.41     2. Insomnia, unspecified type  G47.00        Receiving Psychotherapy: No    RECOMMENDATIONS:  PDMP was reviewed.  Last Xanax filled 06/28/2021. I provided 20 minutes of face to face time during this encounter, including time spent before and after the visit in records review, medical decision making, counseling pertinent to today's visit, and charting.  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  At any rate she feels better and we agreed to leave the Lexapro dose the same. Sleep hygiene discussed.  Other options including mirtazapine or another hypnotic could be used but I recommend sticking with the Xanax, it is working so no need to change.  Continue Xanax 0.5 mg, 1 p.o. twice daily as needed. Continue Lexapro 10 mg, 1 p.o. daily. Return in 6 months.  Donnal Moat, PA-C

## 2021-07-26 ENCOUNTER — Other Ambulatory Visit (HOSPITAL_COMMUNITY): Payer: Self-pay

## 2021-07-26 ENCOUNTER — Other Ambulatory Visit: Payer: Self-pay | Admitting: Physician Assistant

## 2021-07-27 ENCOUNTER — Other Ambulatory Visit (HOSPITAL_COMMUNITY): Payer: Self-pay

## 2021-07-27 MED ORDER — ESCITALOPRAM OXALATE 10 MG PO TABS
10.0000 mg | ORAL_TABLET | Freq: Every day | ORAL | 1 refills | Status: DC
  Filled 2021-07-27: qty 30, 30d supply, fill #0
  Filled 2021-08-23: qty 30, 30d supply, fill #1
  Filled 2021-09-27: qty 30, 30d supply, fill #2
  Filled 2021-10-26: qty 30, 30d supply, fill #3
  Filled 2021-11-25: qty 30, 30d supply, fill #4
  Filled 2021-12-20: qty 30, 30d supply, fill #5

## 2021-07-28 ENCOUNTER — Other Ambulatory Visit (HOSPITAL_COMMUNITY): Payer: Self-pay

## 2021-08-11 ENCOUNTER — Ambulatory Visit (HOSPITAL_BASED_OUTPATIENT_CLINIC_OR_DEPARTMENT_OTHER): Payer: 59 | Admitting: Obstetrics & Gynecology

## 2021-08-19 ENCOUNTER — Ambulatory Visit (HOSPITAL_BASED_OUTPATIENT_CLINIC_OR_DEPARTMENT_OTHER): Payer: 59 | Admitting: Nurse Practitioner

## 2021-08-23 ENCOUNTER — Other Ambulatory Visit (HOSPITAL_COMMUNITY): Payer: Self-pay

## 2021-08-23 ENCOUNTER — Other Ambulatory Visit (HOSPITAL_BASED_OUTPATIENT_CLINIC_OR_DEPARTMENT_OTHER): Payer: Self-pay | Admitting: Nurse Practitioner

## 2021-08-25 ENCOUNTER — Other Ambulatory Visit (HOSPITAL_COMMUNITY)
Admission: RE | Admit: 2021-08-25 | Discharge: 2021-08-25 | Disposition: A | Discharge status: Home / Self Care | Payer: 59 | Source: Ambulatory Visit | Attending: Obstetrics & Gynecology | Admitting: Obstetrics & Gynecology

## 2021-08-25 ENCOUNTER — Ambulatory Visit (INDEPENDENT_AMBULATORY_CARE_PROVIDER_SITE_OTHER): Payer: 59 | Admitting: Obstetrics & Gynecology

## 2021-08-25 ENCOUNTER — Encounter (HOSPITAL_BASED_OUTPATIENT_CLINIC_OR_DEPARTMENT_OTHER): Payer: Self-pay | Admitting: Obstetrics & Gynecology

## 2021-08-25 VITALS — BP 135/88 | HR 75 | Ht 69.0 in | Wt 197.2 lb

## 2021-08-25 DIAGNOSIS — Z124 Encounter for screening for malignant neoplasm of cervix: Secondary | ICD-10-CM | POA: Diagnosis not present

## 2021-08-25 DIAGNOSIS — Z803 Family history of malignant neoplasm of breast: Secondary | ICD-10-CM

## 2021-08-25 DIAGNOSIS — Z01419 Encounter for gynecological examination (general) (routine) without abnormal findings: Secondary | ICD-10-CM | POA: Diagnosis not present

## 2021-08-25 DIAGNOSIS — Z9889 Other specified postprocedural states: Secondary | ICD-10-CM

## 2021-08-25 DIAGNOSIS — R87610 Atypical squamous cells of undetermined significance on cytologic smear of cervix (ASC-US): Secondary | ICD-10-CM

## 2021-08-25 DIAGNOSIS — R32 Unspecified urinary incontinence: Secondary | ICD-10-CM | POA: Diagnosis not present

## 2021-08-25 LAB — POCT URINALYSIS DIPSTICK
Bilirubin, UA: NEGATIVE
Glucose, UA: NEGATIVE
Ketones, UA: NEGATIVE
Leukocytes, UA: NEGATIVE
Nitrite, UA: NEGATIVE
Protein, UA: NEGATIVE
Spec Grav, UA: <=1.005 — AB (ref 1.010–1.025)
Urobilinogen, UA: 0.2 E.U./dL
pH, UA: 5.5 (ref 5.0–8.0)

## 2021-08-25 NOTE — Progress Notes (Signed)
46 y.o. G27P0012 Married Caucasian female here for AEX.  Has single episode of enurysis last night.  This hasn't happened to her as an adult, ever.  Recommended evaluation if occurs again and will test urine today.  Denies any other symptoms.  Does have some leakage with cough or jumping.    Is using some xanax at night for sleep.  She is prescribed this by her psychiatrist.    Cycles are regular.  Flow lasts 3-4 days.  This better then it used to be.  Flow is shorter and not as heavy.  Has lost almost 20 pounds.  Is on ozympic.   Mother with hx of breast cancer.  Was advised not genetically linked.   [place] model with lifetime risk of breast cancer 16.7%.    Sexually active: Yes.    The current method of family planning is vasectomy.    Exercising: tried to walk 30 minutes most days Smoker:  no  Health Maintenance: Pap:  ASUCS with neg HR HPV History of abnormal Pap:  no MMG:  05/05/2021 Colonoscopy:  2015 BMD:  not indicated Screening Labs: with PCP   reports that she has never smoked. She has never used smokeless tobacco. She reports that she does not drink alcohol and does not use drugs.  Past Medical History:  Diagnosis Date   Anal fissure    Anxiety    Asthma, exercise induced    as a teen   Cardiac arrhythmia    Depression    Dysmenorrhea    past   HLD (hyperlipidemia)    Hyperlipidemia    Mononucleosis 1993   Palpitations 10/15/2009   Qualifier: Diagnosis of  By: Orville Govern CMA, Delrae Sawyers     Past Surgical History:  Procedure Laterality Date   CRANIECTOMY SUBOCCIPITAL W/ CERVICAL LAMINECTOMY / CHIARI  2006   [place] N/A 10/13/2020   Procedure: DILATATION & CURETTAGE/HYSTEROSCOPY WITH MYOSURE;  Surgeon: Megan Salon, MD;  Location: [facility];  Service: Gynecology;  Laterality: N/A;    Current Outpatient Medications  Medication Sig Dispense Refill   ALPRAZolam (XANAX) 0.5 MG tablet Take 1 tablet by mouth 2 times  daily as needed for anxiety. 180 tablet 0   cholecalciferol (VITAMIN D3) 25 MCG (1000 UNIT) tablet Take 1,000 Units by mouth daily.     escitalopram (LEXAPRO) 10 MG tablet Take 1 tablet (10 mg total) by mouth daily. 90 tablet 1   hydrochlorothiazide (HYDRODIURIL) 25 MG tablet Take 1 tablet by mouth daily. 90 tablet 1   hydrOXYzine (ATARAX/VISTARIL) 25 MG tablet Take 1 tablet (25 mg total) by mouth at bedtime for sleep & anxiety.  May repeat dose one time if needed. 60 tablet 11   Magnesium 500 MG TABS Take 1,000 mg by mouth at bedtime.     Probiotic Product (PROBIOTIC DAILY PO) Take 1 capsule by mouth at bedtime.     rosuvastatin (CRESTOR) 10 MG tablet Take 1 tablet by mouth at bedtime. 90 tablet 3   Semaglutide, 1 MG/DOSE, 4 MG/3ML SOPN Inject 1 mg as directed once a week. 9 mL 3   No current facility-administered medications for this visit.    Family History  Problem Relation Age of Onset   Breast cancer Mother 63   Hypertension Mother    Thyroid disease Mother        hypothyroid   Colon polyps Mother    Diabetes Mother    Colon cancer Maternal Grandmother  Thyroid disease Maternal Grandmother    Stroke Maternal Grandmother    Kidney disease Maternal Grandmother    Leukemia Maternal Grandfather    Heart disease Maternal Grandfather    ROS: Genitourinary:negative  Exam:   BP 135/88 (BP Location: Left Arm, Patient Position: Sitting, Cuff Size: Large)   Pulse 75   Ht '5\' 9"'$  (1.753 m) Comment: reported  Wt 197 lb 3.2 oz (89.4 kg)   LMP 08/18/2021   BMI 29.12 kg/m   Height: '5\' 9"'$  (175.3 cm) (reported)  General appearance: alert, cooperative and appears stated age Head: Normocephalic, without obvious abnormality, atraumatic Neck: no adenopathy, supple, symmetrical, trachea midline and thyroid normal to inspection and palpation Lungs: clear to auscultation bilaterally Breasts: normal appearance, no masses or tenderness Heart: regular rate and rhythm Abdomen: soft,  non-tender; bowel sounds normal; no masses,  no organomegaly Extremities: extremities normal, atraumatic, no cyanosis or edema Skin: Skin color, texture, turgor normal. No rashes or lesions Lymph nodes: Cervical, supraclavicular, and axillary nodes normal. No abnormal inguinal nodes palpated Neurologic: Grossly normal   Pelvic: External genitalia:  no lesions              Urethra:  normal appearing urethra with no masses, tenderness or lesions              Bartholins and Skenes: normal                 Vagina: normal appearing vagina with normal color and no discharge, no lesions              Cervix: no lesions              Pap taken: Yes.   Bimanual Exam:  Uterus:  normal size, contour, position, consistency, mobility, non-tender              Adnexa: normal adnexa and no mass, fullness, tenderness               Rectovaginal: Confirms               Anus:  normal sphincter tone, no lesions  Chaperone, Octaviano Batty, CMA, was present for exam.  Assessment/Plan: 1. Well woman exam with routine gynecological exam - pap obtained today - MMG 04/2021 - colonoscopy 2015.  Will repeat at age 45. - Vaccines reviewed  2. ASCUS of cervix with negative high risk HPV - Cytology - PAP( [facility])  3. Cervical cancer screening- Cytology - PAP( [facility])  4. Family history of breast cancer in mother - [place] done today with 16.7% lifetime risk. Limited breast MRI discussed.  5. History of hysteroscopy - endometrial polyp present last year.

## 2021-08-30 LAB — CYTOLOGY - PAP
Comment: NEGATIVE
Diagnosis: UNDETERMINED — AB
High risk HPV: NEGATIVE

## 2021-09-02 ENCOUNTER — Encounter (HOSPITAL_BASED_OUTPATIENT_CLINIC_OR_DEPARTMENT_OTHER): Payer: Self-pay | Admitting: Nurse Practitioner

## 2021-09-02 ENCOUNTER — Other Ambulatory Visit (HOSPITAL_COMMUNITY): Payer: Self-pay

## 2021-09-02 ENCOUNTER — Ambulatory Visit (INDEPENDENT_AMBULATORY_CARE_PROVIDER_SITE_OTHER): Payer: 59 | Admitting: Nurse Practitioner

## 2021-09-02 VITALS — BP 116/87 | HR 75 | Temp 97.8°F | Ht 69.0 in | Wt 191.4 lb

## 2021-09-02 DIAGNOSIS — R635 Abnormal weight gain: Secondary | ICD-10-CM | POA: Diagnosis not present

## 2021-09-02 DIAGNOSIS — E559 Vitamin D deficiency, unspecified: Secondary | ICD-10-CM

## 2021-09-02 DIAGNOSIS — E7801 Familial hypercholesterolemia: Secondary | ICD-10-CM

## 2021-09-02 DIAGNOSIS — R7303 Prediabetes: Secondary | ICD-10-CM | POA: Diagnosis not present

## 2021-09-02 DIAGNOSIS — I10 Essential (primary) hypertension: Secondary | ICD-10-CM | POA: Diagnosis not present

## 2021-09-02 DIAGNOSIS — Z0000 Encounter for general adult medical examination without abnormal findings: Secondary | ICD-10-CM

## 2021-09-02 MED ORDER — HYDROCHLOROTHIAZIDE 25 MG PO TABS
25.0000 mg | ORAL_TABLET | Freq: Every day | ORAL | 3 refills | Status: DC
  Filled 2021-09-02 – 2021-09-16 (×2): qty 30, 30d supply, fill #0
  Filled 2021-10-26: qty 30, 30d supply, fill #1
  Filled 2021-12-20: qty 30, 30d supply, fill #2
  Filled 2022-01-23: qty 30, 30d supply, fill #3
  Filled 2022-02-25: qty 30, 30d supply, fill #4
  Filled 2022-03-31: qty 30, 30d supply, fill #5
  Filled 2022-05-03: qty 30, 30d supply, fill #6
  Filled 2022-06-10: qty 30, 30d supply, fill #7

## 2021-09-02 NOTE — Patient Instructions (Addendum)
It was a pleasure seeing you today. I hope your time spent with [place] was pleasant and helpful. Please let us know if there is anything we can do to improve the service you receive.    You are doing fantastic!! I am so proud of you.   If you feel like the medication is not working at the current dose please let me know and we can increase this.   GOOD LUCK THIS FALL!! Please let me know how things go with your new job.   Important Office Information Lab Results If labs were ordered, please note that you will see results through [place] as soon as they come available from [place].  It takes up to 5 business days for the results to be routed to me and for me to review them once all of the lab results have come through from [facility]. I will make recommendations based on your results and send these through [place] or someone from the office will call you to discuss. If your labs are abnormal, we may contact you to schedule a visit to discuss the results and make recommendations.  If you have not heard from [place] within 5 business days or you have waited longer than a week and your lab results have not come through on [place], please feel free to call the office or send a message through [place] to follow-up on these labs.   Referrals If referrals were placed today, the office where the referral was sent will contact you either by phone or through [place] to set up scheduling. Please note that it can take up to a week for the referral office to contact you. If you do not hear from them in a week, please contact the referral office directly to inquire about scheduling.   Condition Treated If your condition worsens or you begin to have new symptoms, please schedule a follow-up appointment for further evaluation. If you are not sure if an appointment is needed, you may call the office to leave a message for the nurse and someone will contact you with recommendations.  If you have an urgent or life threatening  emergency, please do not call the office, but seek emergency evaluation by calling 911 or going to the nearest emergency room for evaluation.   MyChart and Phone Calls Please do not use MyChart for urgent messages. It may take up to 3 business days for MyChart messages to be read by staff and if they are unable to handle the request, an additional 3 business days for them to be routed to me and for my response.  Messages sent to the provider through [place] do not come directly to the provider, please allow time for these messages to be routed and for me to respond.  We get a large volume of MyChart messages daily and these are responded to in the order received.   For urgent messages, please call the office at [phone] and speak with the front office staff or leave a message on the line of my assistant for guidance.  We are seeing patients from the hours of 8:00 am through 5:00 pm and calls directly to the nurse may not be answered immediately due to seeing patients, but your call will be returned as soon as possible.  Phone  messages received after 4:00 PM Monday through Thursday may not be returned until the following business day. Phone messages received after 11:00 AM on Friday may not be returned until Monday.   After  Hours We share on call hours with providers from other offices. If you have an urgent need after hours that cannot wait until the next business day, please contact the on call provider by calling the office number. A nurse will speak with you and contact the provider if needed for recommendations.  If you have an urgent or life threatening emergency after hours, please do not call the on call provider, but seek emergency evaluation by calling 911 or going to the nearest emergency room for evaluation.   Paperwork All paperwork requires a minimum of 5 days to complete and return to you or the designated personnel. Please keep this in mind when bringing in forms or sending  requests for paperwork completion to the office.

## 2021-09-02 NOTE — Progress Notes (Signed)
Judy Keeler, DNP, AGNP-c [address] [place], [place] [address] [phone] Office [phone] Fax  ESTABLISHED PATIENT- Chronic Health and/or Follow-Up Visit  Vitals:   09/02/21 0830  BP: 116/87  Pulse: 75  Temp: 97.8 F (36.6 C)  Height: '5\' 9"'$  (1.753 m)  Weight: 191 lb 6.4 oz (86.8 kg)  SpO2: 100%  TempSrc: Oral  BMI (Calculated): 28.25     '@NAMEBYAGE'$ @ is a 46 y.o. year old female presenting today for evaluation and management of the following: Follow-up (Pt here for f/u on labs )   BRIEF HISTORY, IMPRESSION, RECOMMENDATIONS, and ROS  1. Primary hypertension She endorses well controlled blood pressures. She has been working on diet and exercise to help with weight loss and chronic condition management. She is taking hctz as prescribed and denies any side effects. She denies CP, Shob, palpitations, headaches, dizziness, LE edema.  Chronic. Well controlled at this time. No alarm symptoms are present. Will plan to continue current treatment. Labs monitored today.   2. Prediabetes 3. Weight gain 4. Familial hypercholesterolemia She has been utilizing Ozempic for her chronic condition management and tolerating the medication well. She reports that her appetite has decreased with medication use. Initially she did have some constipation, but this has leveled off. She is not having any unexpected side effects from the medication. She would like to continue on the current therapy. So far she has seen weight loss.  We will plan to continue with her current treatment at this time as she has had success with the treatment of Ozempic. She is really doing fantastic and I do feel that with continued use she will see improved weight management, as well as control of her blood sugar, blood pressure, and lipids levels. We will plan to obtain labs today for evaluation.   5. Vitamin D deficiency Monitoring today. She is taking a  daily OTC vitamin D3 supplement.      All ROS negative with exception of what is listed above.   PHYSICAL EXAM Physical Exam Vitals and nursing note reviewed.  Constitutional:      General: She is not in acute distress.    Appearance: Normal appearance.  HENT:     Head: Normocephalic.  Eyes:     Extraocular Movements: Extraocular movements intact.     Conjunctiva/sclera: Conjunctivae normal.     Pupils: Pupils are equal, round, and reactive to light.  Neck:     Vascular: No carotid bruit.  Cardiovascular:     Rate and Rhythm: Normal rate and regular rhythm.     Pulses: Normal pulses.     Heart sounds: Normal heart sounds. No murmur heard. Pulmonary:     Effort: Pulmonary effort is normal.     Breath sounds: Normal breath sounds. No wheezing.  Abdominal:     General: Bowel sounds are normal. There is no distension.     Palpations: Abdomen is soft.     Tenderness: There is no abdominal tenderness. There is no guarding.  Musculoskeletal:        General: Normal range of motion.     Cervical back: Normal range of motion and neck supple.     Right lower leg: No edema.     Left lower leg: No edema.  Lymphadenopathy:     Cervical: No cervical adenopathy.  Skin:    General: Skin is warm and dry.     Capillary Refill: Capillary refill takes less than 2 seconds.  Neurological:  General: No focal deficit present.     Mental Status: She is alert and oriented to person, place, and time.  Psychiatric:        Mood and Affect: Mood normal.        Behavior: Behavior normal.        Thought Content: Thought content normal.        Judgment: Judgment normal.     PLAN Problem List Items Addressed This Visit     Prediabetes    She has been utilizing Ozempic for her chronic condition management and tolerating the medication well. She reports that her appetite has decreased with medication use. Initially she did have some constipation, but this has leveled off. She is not having any  unexpected side effects from the medication. She would like to continue on the current therapy. So far she has seen weight loss.  We will plan to continue with her current treatment at this time as she has had success with the treatment of Ozempic. She is really doing fantastic and I do feel that with continued use she will see improved weight management, as well as control of her blood sugar, blood pressure, and lipids levels. We will plan to obtain labs today for evaluation.       Relevant Orders   CBC with Differential/Platelet (Completed)   Comprehensive metabolic panel (Completed)   Hemoglobin A1c (Completed)   Lipid panel (Completed)   VITAMIN D 25 Hydroxy (Vit-D Deficiency, Fractures) (Completed)   Weight gain    She has been utilizing Ozempic for her chronic condition management and tolerating the medication well. She reports that her appetite has decreased with medication use. Initially she did have some constipation, but this has leveled off. She is not having any unexpected side effects from the medication. She would like to continue on the current therapy. So far she has seen weight loss.  We will plan to continue with her current treatment at this time as she has had success with the treatment of Ozempic. She is really doing fantastic and I do feel that with continued use she will see improved weight management, as well as control of her blood sugar, blood pressure, and lipids levels. We will plan to obtain labs today for evaluation.       Relevant Orders   CBC with Differential/Platelet (Completed)   Comprehensive metabolic panel (Completed)   Hemoglobin A1c (Completed)   Lipid panel (Completed)   VITAMIN D 25 Hydroxy (Vit-D Deficiency, Fractures) (Completed)   Familial hypercholesterolemia    She has been utilizing Ozempic for her chronic condition management and tolerating the medication well. She reports that her appetite has decreased with medication use. Initially she did  have some constipation, but this has leveled off. She is not having any unexpected side effects from the medication. She would like to continue on the current therapy. So far she has seen weight loss.  We will plan to continue with her current treatment at this time as she has had success with the treatment of Ozempic. She is really doing fantastic and I do feel that with continued use she will see improved weight management, as well as control of her blood sugar, blood pressure, and lipids levels. We will plan to obtain labs today for evaluation.       Relevant Medications   hydrochlorothiazide (HYDRODIURIL) 25 MG tablet   Other Relevant Orders   CBC with Differential/Platelet (Completed)   Comprehensive metabolic panel (Completed)   Hemoglobin A1c (Completed)  Lipid panel (Completed)   VITAMIN D 25 Hydroxy (Vit-D Deficiency, Fractures) (Completed)   Primary hypertension - Primary    She endorses well controlled blood pressures. She has been working on diet and exercise to help with weight loss and chronic condition management. She is taking hctz as prescribed and denies any side effects. She denies CP, Shob, palpitations, headaches, dizziness, LE edema.  Chronic. Well controlled at this time. No alarm symptoms are present. Will plan to continue current treatment. Labs monitored today.       Relevant Medications   hydrochlorothiazide (HYDRODIURIL) 25 MG tablet   Other Relevant Orders   CBC with Differential/Platelet (Completed)   Comprehensive metabolic panel (Completed)   Hemoglobin A1c (Completed)   Lipid panel (Completed)   VITAMIN D 25 Hydroxy (Vit-D Deficiency, Fractures) (Completed)   Vitamin D deficiency    Monitoring today. She is taking a daily OTC vitamin D3 supplement.       Relevant Orders   CBC with Differential/Platelet (Completed)   Comprehensive metabolic panel (Completed)   Hemoglobin A1c (Completed)   Lipid panel (Completed)   VITAMIN D 25 Hydroxy (Vit-D  Deficiency, Fractures) (Completed)      FOLLOW-UP Return in about 1 year (around 09/03/2022) for Labs 6 months- CPE in 1 year.   Judy Keeler, DNP, AGNP-c 09/02/2021  8:31 AM

## 2021-09-03 LAB — LIPID PANEL
Chol/HDL Ratio: 3.5 ratio (ref 0.0–4.4)
Cholesterol, Total: 163 mg/dL (ref 100–199)
HDL: 46 mg/dL (ref >39)
LDL Chol Calc (NIH): 88 mg/dL (ref 0–99)
Triglycerides: 169 mg/dL — ABNORMAL HIGH (ref 0–149)
VLDL Cholesterol Cal: 29 mg/dL (ref 5–40)

## 2021-09-03 LAB — COMPREHENSIVE METABOLIC PANEL
ALT: 37 IU/L — ABNORMAL HIGH (ref 0–32)
AST: 30 IU/L (ref 0–40)
Albumin/Globulin Ratio: 1.7 (ref 1.2–2.2)
Albumin: 4.8 g/dL (ref 3.9–4.9)
Alkaline Phosphatase: 60 IU/L (ref 44–121)
BUN/Creatinine Ratio: 12 (ref 9–23)
BUN: 10 mg/dL (ref 6–24)
Bilirubin Total: 0.5 mg/dL (ref 0.0–1.2)
CO2: 25 mmol/L (ref 20–29)
Calcium: 9.5 mg/dL (ref 8.7–10.2)
Chloride: 94 mmol/L — ABNORMAL LOW (ref 96–106)
Creatinine, Ser: 0.85 mg/dL (ref 0.57–1.00)
Globulin, Total: 2.8 g/dL (ref 1.5–4.5)
Glucose: 86 mg/dL (ref 70–99)
Potassium: 3.8 mmol/L (ref 3.5–5.2)
Sodium: 139 mmol/L (ref 134–144)
Total Protein: 7.6 g/dL (ref 6.0–8.5)
eGFR: 87 mL/min/{1.73_m2} (ref >59)

## 2021-09-03 LAB — CBC WITH DIFFERENTIAL/PLATELET
Basophils Absolute: 0 10*3/uL (ref 0.0–0.2)
Basos: 0 %
EOS (ABSOLUTE): 0.1 10*3/uL (ref 0.0–0.4)
Eos: 2 %
Hematocrit: 42.5 % (ref 34.0–46.6)
Hemoglobin: 14.1 g/dL (ref 11.1–15.9)
Immature Grans (Abs): 0 10*3/uL (ref 0.0–0.1)
Immature Granulocytes: 0 %
Lymphocytes Absolute: 2.6 10*3/uL (ref 0.7–3.1)
Lymphs: 37 %
MCH: 30.9 pg (ref 26.6–33.0)
MCHC: 33.2 g/dL (ref 31.5–35.7)
MCV: 93 fL (ref 79–97)
Monocytes Absolute: 0.5 10*3/uL (ref 0.1–0.9)
Monocytes: 7 %
Neutrophils Absolute: 3.8 10*3/uL (ref 1.4–7.0)
Neutrophils: 54 %
Platelets: 325 10*3/uL (ref 150–450)
RBC: 4.56 x10E6/uL (ref 3.77–5.28)
RDW: 12.2 % (ref 11.7–15.4)
WBC: 7.1 10*3/uL (ref 3.4–10.8)

## 2021-09-03 LAB — HEMOGLOBIN A1C
Est. average glucose Bld gHb Est-mCnc: 114 mg/dL
Hgb A1c MFr Bld: 5.6 % (ref 4.8–5.6)

## 2021-09-03 LAB — VITAMIN D 25 HYDROXY (VIT D DEFICIENCY, FRACTURES): Vit D, 25-Hydroxy: 42.4 ng/mL (ref 30.0–100.0)

## 2021-09-13 ENCOUNTER — Other Ambulatory Visit (HOSPITAL_COMMUNITY): Payer: Self-pay

## 2021-09-16 ENCOUNTER — Other Ambulatory Visit (HOSPITAL_COMMUNITY): Payer: Self-pay

## 2021-09-27 ENCOUNTER — Other Ambulatory Visit (HOSPITAL_COMMUNITY): Payer: Self-pay

## 2021-10-07 ENCOUNTER — Encounter (HOSPITAL_BASED_OUTPATIENT_CLINIC_OR_DEPARTMENT_OTHER): Payer: Self-pay | Admitting: Nurse Practitioner

## 2021-10-07 DIAGNOSIS — R635 Abnormal weight gain: Secondary | ICD-10-CM

## 2021-10-07 DIAGNOSIS — I10 Essential (primary) hypertension: Secondary | ICD-10-CM

## 2021-10-07 DIAGNOSIS — E7801 Familial hypercholesterolemia: Secondary | ICD-10-CM

## 2021-10-07 DIAGNOSIS — R7303 Prediabetes: Secondary | ICD-10-CM

## 2021-10-07 DIAGNOSIS — E669 Obesity, unspecified: Secondary | ICD-10-CM

## 2021-10-08 ENCOUNTER — Other Ambulatory Visit (HOSPITAL_COMMUNITY): Payer: Self-pay

## 2021-10-08 MED ORDER — SEMAGLUTIDE (2 MG/DOSE) 8 MG/3ML ~~LOC~~ SOPN
2.0000 mg | PEN_INJECTOR | SUBCUTANEOUS | 3 refills | Status: DC
  Filled 2021-10-08: qty 3, 28d supply, fill #0

## 2021-10-10 NOTE — Assessment & Plan Note (Signed)
She has been utilizing Ozempic for her chronic condition management and tolerating the medication well. She reports that her appetite has decreased with medication use. Initially she did have some constipation, but this has leveled off. She is not having any unexpected side effects from the medication. She would like to continue on the current therapy. So far she has seen weight loss.  We will plan to continue with her current treatment at this time as she has had success with the treatment of Ozempic. She is really doing fantastic and I do feel that with continued use she will see improved weight management, as well as control of her blood sugar, blood pressure, and lipids levels. We will plan to obtain labs today for evaluation.

## 2021-10-10 NOTE — Assessment & Plan Note (Signed)
She endorses well controlled blood pressures. She has been working on diet and exercise to help with weight loss and chronic condition management. She is taking hctz as prescribed and denies any side effects. She denies CP, Shob, palpitations, headaches, dizziness, LE edema.  Chronic. Well controlled at this time. No alarm symptoms are present. Will plan to continue current treatment. Labs monitored today.

## 2021-10-10 NOTE — Assessment & Plan Note (Signed)
Monitoring today. She is taking a daily OTC vitamin D3 supplement.

## 2021-10-26 ENCOUNTER — Other Ambulatory Visit (HOSPITAL_COMMUNITY): Payer: Self-pay

## 2021-11-25 ENCOUNTER — Other Ambulatory Visit (HOSPITAL_COMMUNITY): Payer: Self-pay

## 2021-11-29 ENCOUNTER — Encounter (HOSPITAL_BASED_OUTPATIENT_CLINIC_OR_DEPARTMENT_OTHER): Payer: Self-pay | Admitting: Nurse Practitioner

## 2021-11-29 ENCOUNTER — Other Ambulatory Visit (HOSPITAL_BASED_OUTPATIENT_CLINIC_OR_DEPARTMENT_OTHER): Payer: Self-pay | Admitting: Nurse Practitioner

## 2021-11-29 DIAGNOSIS — E7801 Familial hypercholesterolemia: Secondary | ICD-10-CM

## 2021-11-29 DIAGNOSIS — R635 Abnormal weight gain: Secondary | ICD-10-CM

## 2021-11-29 DIAGNOSIS — I10 Essential (primary) hypertension: Secondary | ICD-10-CM

## 2021-11-29 DIAGNOSIS — R7303 Prediabetes: Secondary | ICD-10-CM

## 2021-11-29 MED ORDER — WEGOVY 0.25 MG/0.5ML ~~LOC~~ SOAJ
0.2500 mg | SUBCUTANEOUS | 0 refills | Status: DC
  Filled 2021-11-29: qty 2, 28d supply, fill #0

## 2021-11-30 ENCOUNTER — Other Ambulatory Visit (HOSPITAL_COMMUNITY): Payer: Self-pay

## 2021-12-03 ENCOUNTER — Other Ambulatory Visit (HOSPITAL_COMMUNITY): Payer: Self-pay

## 2021-12-20 ENCOUNTER — Other Ambulatory Visit (HOSPITAL_COMMUNITY): Payer: Self-pay

## 2021-12-30 ENCOUNTER — Other Ambulatory Visit (HOSPITAL_COMMUNITY): Payer: Self-pay

## 2022-01-05 ENCOUNTER — Other Ambulatory Visit (HOSPITAL_COMMUNITY): Payer: Self-pay

## 2022-01-20 ENCOUNTER — Ambulatory Visit: Payer: 59 | Admitting: Physician Assistant

## 2022-01-23 ENCOUNTER — Other Ambulatory Visit: Payer: Self-pay | Admitting: Physician Assistant

## 2022-01-24 ENCOUNTER — Other Ambulatory Visit (HOSPITAL_COMMUNITY): Payer: Self-pay

## 2022-01-25 ENCOUNTER — Encounter: Payer: Self-pay | Admitting: Nurse Practitioner

## 2022-01-25 ENCOUNTER — Encounter (HOSPITAL_COMMUNITY): Payer: Self-pay

## 2022-01-25 ENCOUNTER — Other Ambulatory Visit (HOSPITAL_COMMUNITY): Payer: Self-pay

## 2022-01-25 DIAGNOSIS — E669 Obesity, unspecified: Secondary | ICD-10-CM

## 2022-01-25 DIAGNOSIS — R635 Abnormal weight gain: Secondary | ICD-10-CM

## 2022-01-25 DIAGNOSIS — E7801 Familial hypercholesterolemia: Secondary | ICD-10-CM

## 2022-01-25 DIAGNOSIS — R7303 Prediabetes: Secondary | ICD-10-CM

## 2022-01-25 DIAGNOSIS — I10 Essential (primary) hypertension: Secondary | ICD-10-CM

## 2022-01-26 ENCOUNTER — Other Ambulatory Visit (HOSPITAL_COMMUNITY): Payer: Self-pay

## 2022-01-27 MED ORDER — WEGOVY 0.5 MG/0.5ML ~~LOC~~ SOAJ
0.5000 mg | SUBCUTANEOUS | 2 refills | Status: DC
  Filled 2022-01-27 – 2022-06-10 (×5): qty 2, 28d supply, fill #0

## 2022-01-28 ENCOUNTER — Other Ambulatory Visit (HOSPITAL_COMMUNITY): Payer: Self-pay

## 2022-01-31 ENCOUNTER — Other Ambulatory Visit (HOSPITAL_COMMUNITY): Payer: Self-pay

## 2022-01-31 MED ORDER — ESCITALOPRAM OXALATE 10 MG PO TABS
10.0000 mg | ORAL_TABLET | Freq: Every day | ORAL | 0 refills | Status: DC
  Filled 2022-01-31: qty 30, 30d supply, fill #0
  Filled 2022-02-25: qty 30, 30d supply, fill #1
  Filled 2022-03-31: qty 30, 30d supply, fill #2

## 2022-01-31 NOTE — Telephone Encounter (Signed)
Pt checking on the status of her lexapro 10 mg.the patient is out and has an appt 12/22

## 2022-02-01 IMAGING — MG MM DIGITAL SCREENING BILAT W/ TOMO AND CAD
8 series · 8 of 24 positions shown · non-contrast
Comparison: Previous exam(s).

CLINICAL DATA: Screening.

EXAM:
DIGITAL SCREENING BILATERAL MAMMOGRAM WITH TOMO AND CAD

[R CC synth-2D]
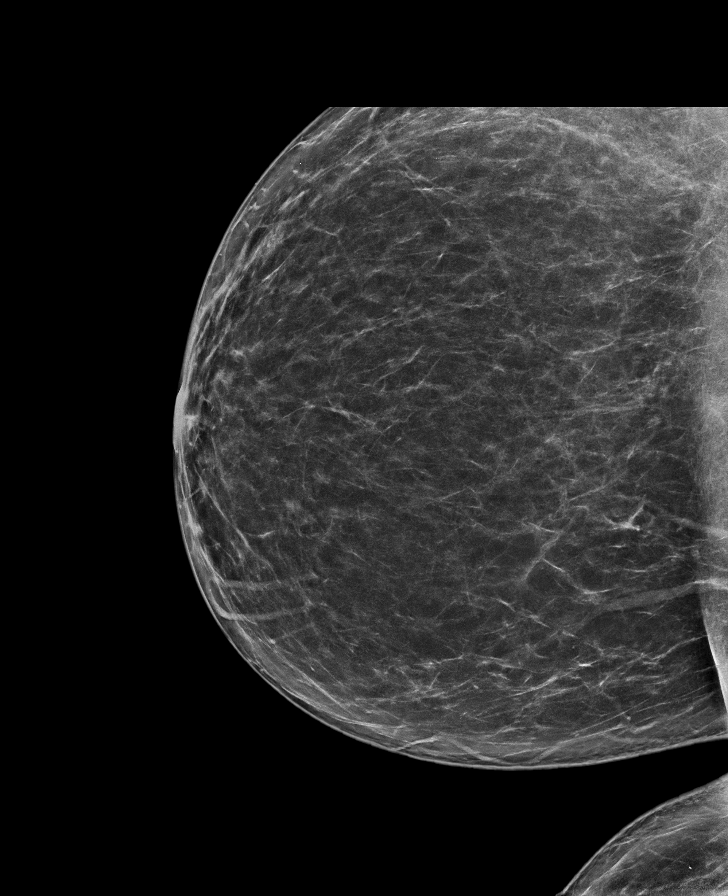

[L MLO synth-2D]
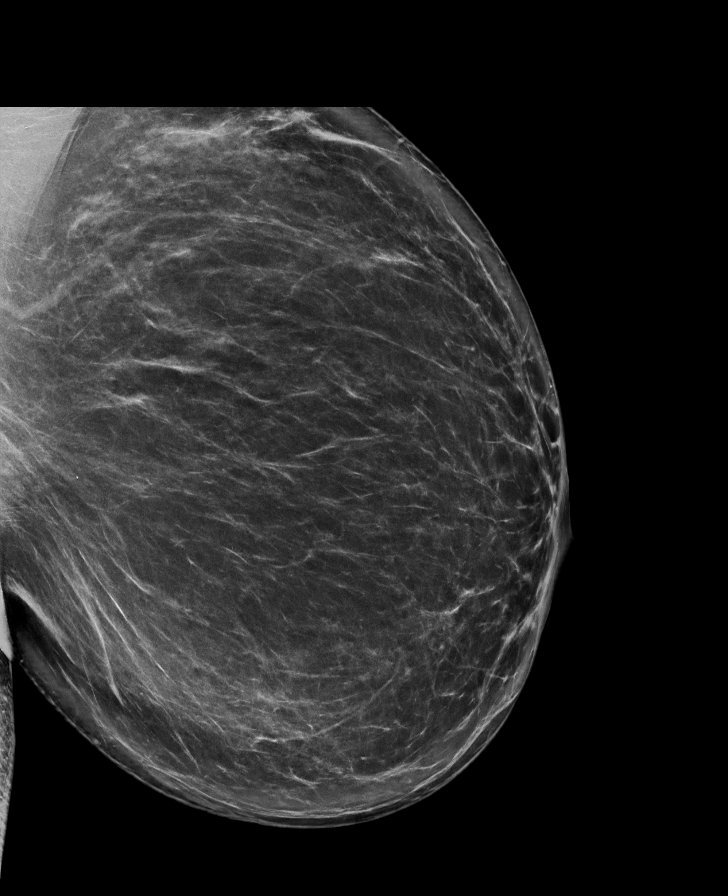

[L CC synth-2D]
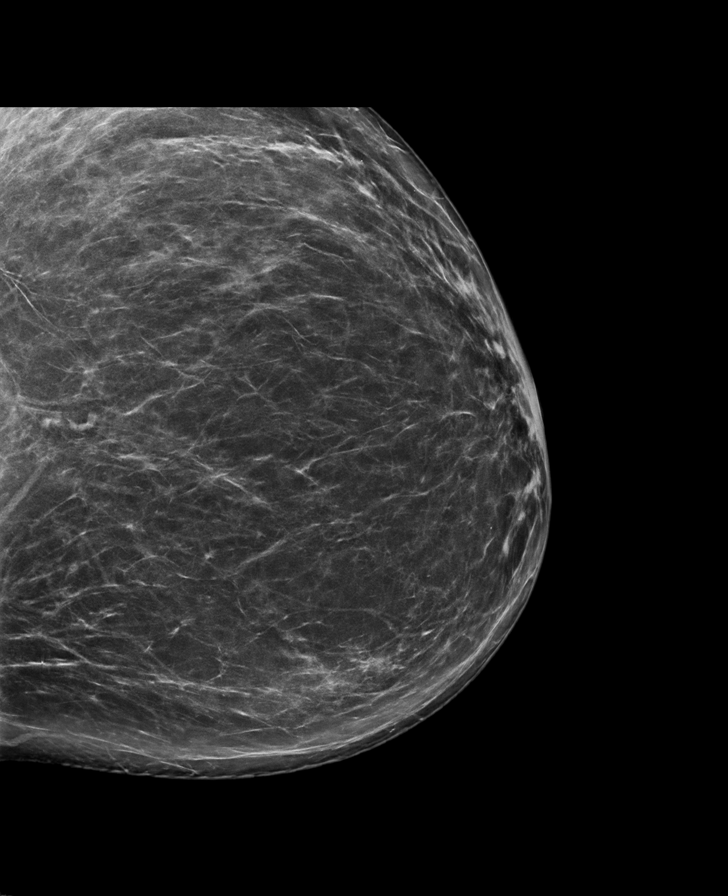

[R MLO synth-2D]
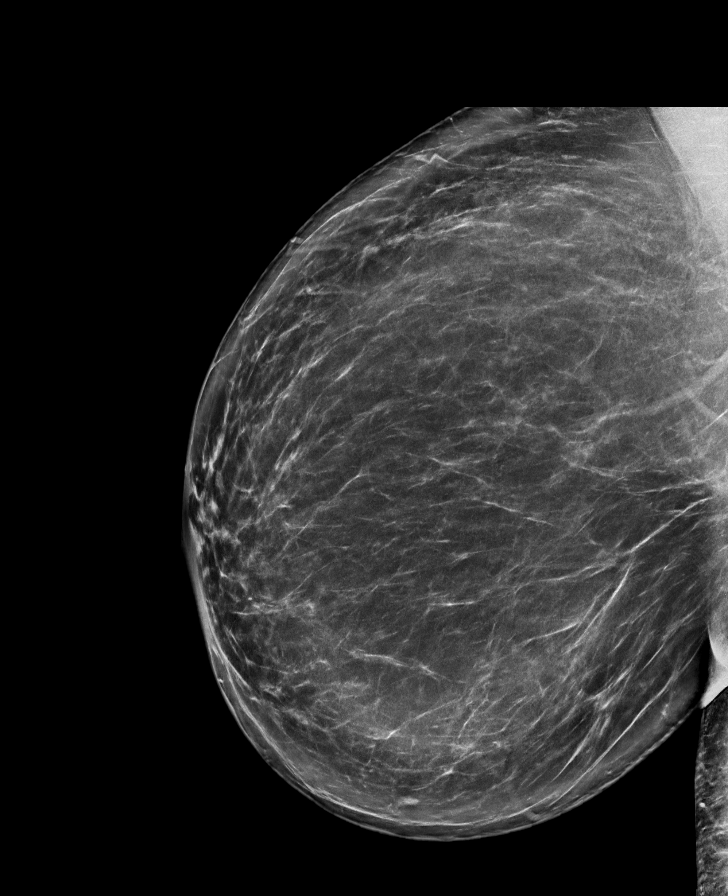

[L MLO tomo · tomo slice 53/104.0]
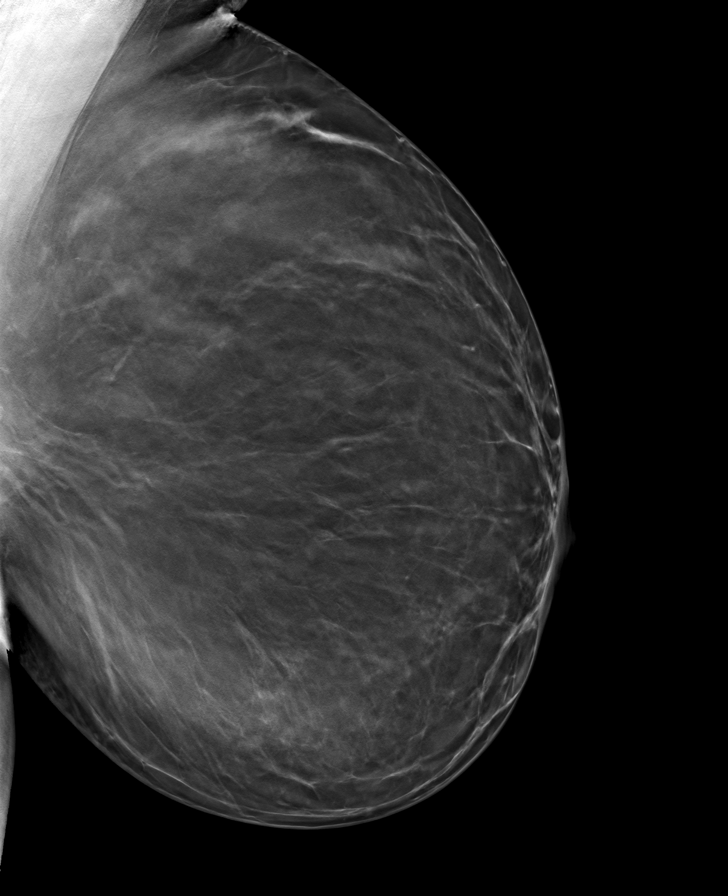

[L CC tomo · tomo slice 47/94.0]
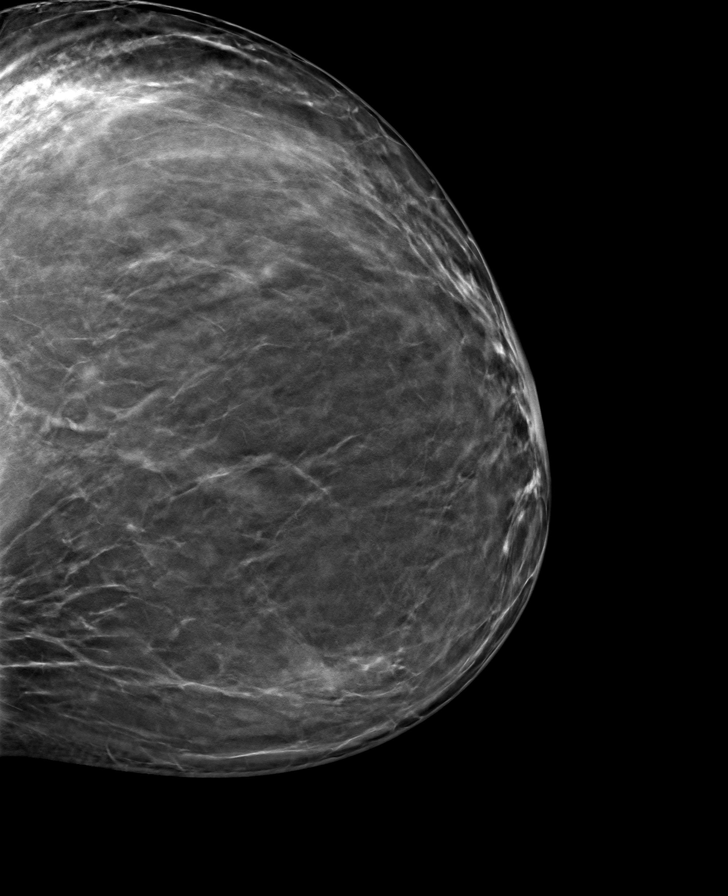

[R MLO tomo · tomo slice 53/105.0]
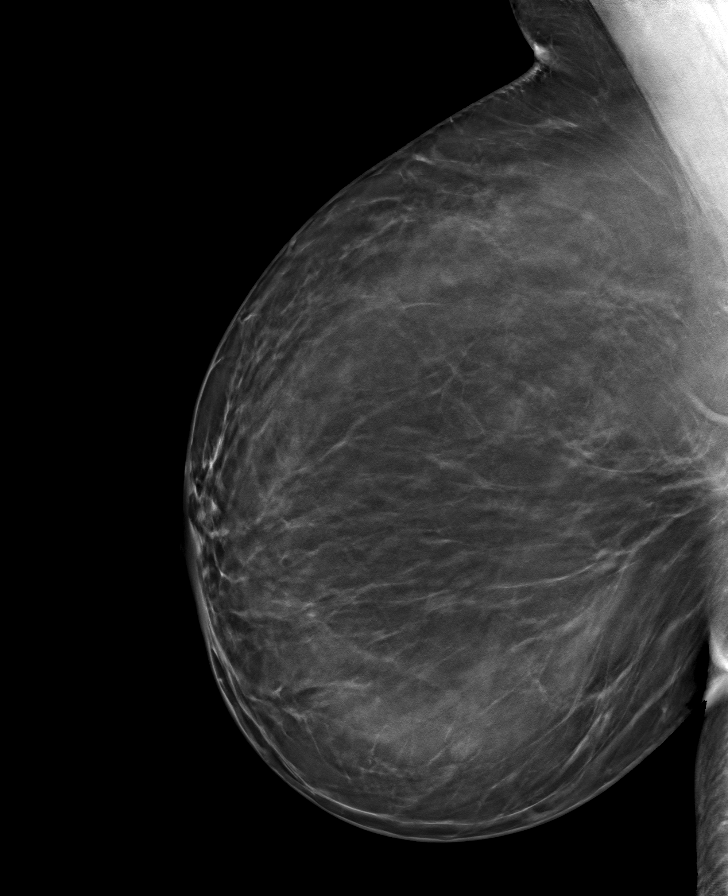

[R CC tomo · tomo slice 45/90.0]
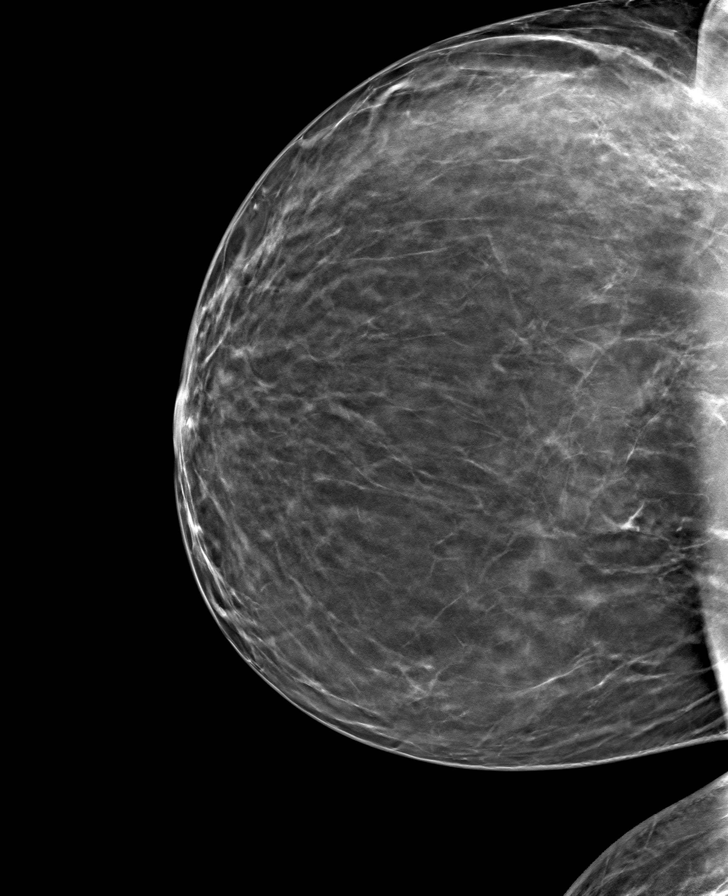

[8 of 24 positions shown; findings below may reference images not displayed]

ACR Breast Density Category b: There are scattered areas of
fibroglandular density.
FINDINGS: There are no findings suspicious for malignancy. The images were
evaluated with computer-aided detection.
IMPRESSION: No mammographic evidence of malignancy. A result letter of this
screening mammogram will be mailed directly to the patient.

RECOMMENDATION:
Screening mammogram in one year. (Code:ZP-[id number])

BI-RADS CATEGORY  1: Negative.

## 2022-02-10 ENCOUNTER — Other Ambulatory Visit (HOSPITAL_COMMUNITY): Payer: Self-pay

## 2022-02-11 ENCOUNTER — Ambulatory Visit: Payer: 59 | Admitting: Physician Assistant

## 2022-02-23 ENCOUNTER — Other Ambulatory Visit (HOSPITAL_COMMUNITY): Payer: Self-pay

## 2022-02-25 ENCOUNTER — Other Ambulatory Visit (HOSPITAL_BASED_OUTPATIENT_CLINIC_OR_DEPARTMENT_OTHER): Payer: Self-pay | Admitting: Nurse Practitioner

## 2022-02-25 DIAGNOSIS — I10 Essential (primary) hypertension: Secondary | ICD-10-CM

## 2022-02-25 DIAGNOSIS — R7303 Prediabetes: Secondary | ICD-10-CM

## 2022-02-25 DIAGNOSIS — E7801 Familial hypercholesterolemia: Secondary | ICD-10-CM

## 2022-02-26 ENCOUNTER — Other Ambulatory Visit (HOSPITAL_COMMUNITY): Payer: Self-pay

## 2022-02-28 ENCOUNTER — Other Ambulatory Visit: Payer: Self-pay

## 2022-02-28 ENCOUNTER — Other Ambulatory Visit (HOSPITAL_COMMUNITY): Payer: Self-pay

## 2022-02-28 MED ORDER — ROSUVASTATIN CALCIUM 10 MG PO TABS
10.0000 mg | ORAL_TABLET | Freq: Every day | ORAL | 0 refills | Status: DC
  Filled 2022-02-28: qty 90, 90d supply, fill #0

## 2022-03-04 ENCOUNTER — Encounter: Payer: Self-pay | Admitting: Internal Medicine

## 2022-03-07 ENCOUNTER — Other Ambulatory Visit (HOSPITAL_COMMUNITY): Payer: Self-pay

## 2022-03-07 ENCOUNTER — Ambulatory Visit (HOSPITAL_BASED_OUTPATIENT_CLINIC_OR_DEPARTMENT_OTHER): Payer: 59

## 2022-03-08 ENCOUNTER — Other Ambulatory Visit: Payer: Self-pay

## 2022-03-08 ENCOUNTER — Other Ambulatory Visit (HOSPITAL_COMMUNITY): Payer: Self-pay

## 2022-03-16 ENCOUNTER — Other Ambulatory Visit: Payer: Self-pay

## 2022-03-20 ENCOUNTER — Encounter: Payer: Self-pay | Admitting: Nurse Practitioner

## 2022-03-24 ENCOUNTER — Other Ambulatory Visit: Payer: Self-pay

## 2022-03-31 ENCOUNTER — Other Ambulatory Visit: Payer: Self-pay

## 2022-03-31 ENCOUNTER — Other Ambulatory Visit: Payer: Self-pay | Admitting: Physician Assistant

## 2022-03-31 NOTE — Telephone Encounter (Signed)
Please call to schedule an appt. Has canceled the last two.

## 2022-04-12 ENCOUNTER — Other Ambulatory Visit (HOSPITAL_COMMUNITY): Payer: Self-pay

## 2022-04-25 ENCOUNTER — Other Ambulatory Visit: Payer: Self-pay

## 2022-04-25 ENCOUNTER — Other Ambulatory Visit (HOSPITAL_COMMUNITY): Payer: Self-pay

## 2022-05-03 ENCOUNTER — Other Ambulatory Visit: Payer: Self-pay | Admitting: Physician Assistant

## 2022-05-03 ENCOUNTER — Other Ambulatory Visit: Payer: Self-pay

## 2022-05-04 ENCOUNTER — Encounter: Payer: Self-pay | Admitting: Nurse Practitioner

## 2022-05-04 ENCOUNTER — Other Ambulatory Visit (HOSPITAL_COMMUNITY): Payer: Self-pay

## 2022-05-10 ENCOUNTER — Other Ambulatory Visit: Payer: Self-pay | Admitting: Physician Assistant

## 2022-05-11 MED ORDER — ESCITALOPRAM OXALATE 10 MG PO TABS
10.0000 mg | ORAL_TABLET | Freq: Every day | ORAL | 0 refills | Status: DC
  Filled 2022-05-11: qty 30, 30d supply, fill #0

## 2022-05-12 ENCOUNTER — Other Ambulatory Visit (HOSPITAL_COMMUNITY): Payer: Self-pay

## 2022-05-18 ENCOUNTER — Ambulatory Visit (INDEPENDENT_AMBULATORY_CARE_PROVIDER_SITE_OTHER): Payer: 59 | Admitting: Physician Assistant

## 2022-05-18 ENCOUNTER — Encounter: Payer: Self-pay | Admitting: Physician Assistant

## 2022-05-18 ENCOUNTER — Other Ambulatory Visit (HOSPITAL_COMMUNITY): Payer: Self-pay

## 2022-05-18 DIAGNOSIS — F3342 Major depressive disorder, recurrent, in full remission: Secondary | ICD-10-CM | POA: Diagnosis not present

## 2022-05-18 DIAGNOSIS — F411 Generalized anxiety disorder: Secondary | ICD-10-CM | POA: Diagnosis not present

## 2022-05-18 MED ORDER — ALPRAZOLAM 0.5 MG PO TABS
0.5000 mg | ORAL_TABLET | Freq: Two times a day (BID) | ORAL | 0 refills | Status: DC | PRN
  Filled 2022-05-18: qty 180, 90d supply, fill #0

## 2022-05-18 MED ORDER — ESCITALOPRAM OXALATE 10 MG PO TABS
10.0000 mg | ORAL_TABLET | Freq: Every day | ORAL | 1 refills | Status: DC
  Filled 2022-05-18 – 2022-06-10 (×2): qty 90, 90d supply, fill #0
  Filled 2022-09-14: qty 90, 90d supply, fill #1

## 2022-05-18 NOTE — Progress Notes (Signed)
[facility] Med Check  Patient ID: Judy Foster,  MRN: [id number]  PCP: Orma Render, NP  Date of Evaluation: 05/18/2022 Time spent:20 minutes  Chief Complaint:  Chief Complaint   Anxiety; Depression; Follow-up    HISTORY/CURRENT STATUS: HPI for routine med check.  Doing well. Feels that meds are working. Patient is able to enjoy things.  Energy and motivation are good.  Work is going well. 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  No extreme sadness, tearfulness, or feelings of hopelessness.  Sleeps well most of the time. Sometimes needs to take Xanax to help her go to sleep. ADLs and personal hygiene are normal.   Denies any changes in concentration, making decisions, or remembering things.  Appetite has not changed.  Weight is stable. Rarely has anxiety bad enough to need Xanax during the day. Work can cause anxiety, with the newness of it and such, but doesn't take the Xanax often.  Denies suicidal or homicidal thoughts.  Patient denies increased energy with decreased need for sleep, increased talkativeness, racing thoughts, impulsivity or risky behaviors, increased spending, increased libido, grandiosity, increased irritability or anger, paranoia, or hallucinations.  Denies dizziness, syncope, seizures, numbness, tingling, tremor, tics, unsteady gait, slurred speech, confusion. Denies muscle or joint pain, stiffness, or dystonia.  Individual Medical History/ Review of Systems: Changes? :No     Past medications for mental health diagnoses include: Prozac caused drowiness, Wellbutrin, Zoloft seemed to work for [employer], trazodone, Melatonin, Viibryd caused headaches, Lexapro, Xanax, Hydroxyzine for   Allergies: Penicillins  Current Medications:  Current Outpatient Medications:    cholecalciferol (VITAMIN D3) 25 MCG (1000 UNIT) tablet, Take 1,000 Units by mouth daily., Disp: , Rfl:    hydrochlorothiazide (HYDRODIURIL) 25 MG tablet, Take 1 tablet by mouth daily., Disp:  90 tablet, Rfl: 3   Magnesium 500 MG TABS, Take 1,000 mg by mouth at bedtime., Disp: , Rfl:    Probiotic Product (PROBIOTIC DAILY PO), Take 1 capsule by mouth at bedtime., Disp: , Rfl:    rosuvastatin (CRESTOR) 10 MG tablet, Take 1 tablet by mouth at bedtime., Disp: 90 tablet, Rfl: 0   ALPRAZolam (XANAX) 0.5 MG tablet, Take 1 tablet by mouth 2 times daily as needed for anxiety., Disp: 180 tablet, Rfl: 0   escitalopram (LEXAPRO) 10 MG tablet, Take 1 tablet (10 mg total) by mouth daily., Disp: 90 tablet, Rfl: 1   Semaglutide-Weight Management (WEGOVY) 0.5 MG/0.5ML SOAJ, Inject 0.5 mg into the skin once a week. (Patient not taking: Reported on 05/18/2022), Disp: 2 mL, Rfl: 2 Medication Side Effects: none  Family Medical/ Social History: Changes?  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  General Appearance: Casual, Neat and Well Groomed  Eye Contact:  Good  Speech:  Clear and Coherent and Normal Rate  Volume:  Normal  Mood:  Euthymic  Affect:  Congruent  Thought Process:  Goal Directed and Descriptions of Associations: Circumstantial  Orientation:  Full (Time, Place, and Person)  Thought Content: Logical   Suicidal Thoughts:  No  Homicidal Thoughts:  No  Memory:  WNL  Judgement:  Good  Insight:  Good  Psychomotor Activity:  Normal  Concentration:  Concentration: Good and Attention Span: Good  Recall:  Good  Fund of Knowledge: Good  Language: Good  Assets:  Communication Skills Desire for Improvement Financial  Resources/Insurance Ambulance person  ADL's:  Intact  Cognition: WNL  Prognosis:  Good   DIAGNOSES:    ICD-10-CM   1. Recurrent major depressive disorder, in full remission ([place])  F33.42     2. Generalized anxiety disorder  F41.1       Receiving Psychotherapy: No   RECOMMENDATIONS:  PDMP was  reviewed.  Last Xanax filled 08/23/2021. I provided 20 minutes of face to face time during this encounter, including time spent before and after the visit in records review, medical decision making, counseling pertinent to today's visit, and charting.   Congratulations on her new job! She is doing well so no changes needed.  Continue Xanax 0.5 mg, 1 p.o. twice daily as needed. Continue Lexapro 10 mg, 1 p.o. daily. Return in 6 months.  Donnal Moat, PA-C

## 2022-05-20 ENCOUNTER — Other Ambulatory Visit (HOSPITAL_COMMUNITY): Payer: Self-pay

## 2022-05-21 ENCOUNTER — Other Ambulatory Visit (HOSPITAL_COMMUNITY): Payer: Self-pay

## 2022-05-23 ENCOUNTER — Other Ambulatory Visit (HOSPITAL_COMMUNITY): Payer: Self-pay

## 2022-05-23 ENCOUNTER — Other Ambulatory Visit: Payer: Self-pay | Admitting: Nurse Practitioner

## 2022-05-23 DIAGNOSIS — Z1231 Encounter for screening mammogram for malignant neoplasm of breast: Secondary | ICD-10-CM

## 2022-05-25 ENCOUNTER — Other Ambulatory Visit: Payer: Self-pay

## 2022-05-26 ENCOUNTER — Other Ambulatory Visit: Payer: Self-pay

## 2022-06-10 ENCOUNTER — Other Ambulatory Visit (HOSPITAL_BASED_OUTPATIENT_CLINIC_OR_DEPARTMENT_OTHER): Payer: Self-pay | Admitting: Nurse Practitioner

## 2022-06-10 ENCOUNTER — Other Ambulatory Visit (HOSPITAL_COMMUNITY): Payer: Self-pay

## 2022-06-10 ENCOUNTER — Other Ambulatory Visit: Payer: Self-pay

## 2022-06-10 DIAGNOSIS — I10 Essential (primary) hypertension: Secondary | ICD-10-CM

## 2022-06-10 DIAGNOSIS — R7303 Prediabetes: Secondary | ICD-10-CM

## 2022-06-10 DIAGNOSIS — E7801 Familial hypercholesterolemia: Secondary | ICD-10-CM

## 2022-06-10 MED ORDER — ROSUVASTATIN CALCIUM 10 MG PO TABS
10.0000 mg | ORAL_TABLET | Freq: Every day | ORAL | 0 refills | Status: DC
  Filled 2022-06-10: qty 90, 90d supply, fill #0

## 2022-06-16 ENCOUNTER — Other Ambulatory Visit: Payer: Self-pay

## 2022-06-16 ENCOUNTER — Other Ambulatory Visit (HOSPITAL_COMMUNITY): Payer: Self-pay

## 2022-06-22 ENCOUNTER — Telehealth: Payer: Self-pay

## 2022-06-22 NOTE — Telephone Encounter (Signed)
Per cover my meds, a PA can't be completed as [id number] isn't a covered pharmacy benefit.

## 2022-06-22 NOTE — Telephone Encounter (Signed)
Pt.notified

## 2022-06-23 ENCOUNTER — Other Ambulatory Visit: Payer: Self-pay

## 2022-06-29 ENCOUNTER — Ambulatory Visit
Admission: RE | Admit: 2022-06-29 | Discharge: 2022-06-29 | Disposition: A | Discharge status: Home / Self Care | Payer: 59 | Source: Ambulatory Visit | Attending: Nurse Practitioner

## 2022-06-29 ENCOUNTER — Encounter: Payer: Self-pay | Admitting: Nurse Practitioner

## 2022-06-29 ENCOUNTER — Other Ambulatory Visit (HOSPITAL_COMMUNITY): Payer: Self-pay

## 2022-06-29 ENCOUNTER — Ambulatory Visit: Payer: 59 | Admitting: Nurse Practitioner

## 2022-06-29 VITALS — BP 124/78 | HR 65 | Wt 205.4 lb

## 2022-06-29 DIAGNOSIS — I10 Essential (primary) hypertension: Secondary | ICD-10-CM | POA: Diagnosis not present

## 2022-06-29 DIAGNOSIS — R5382 Chronic fatigue, unspecified: Secondary | ICD-10-CM

## 2022-06-29 DIAGNOSIS — E7801 Familial hypercholesterolemia: Secondary | ICD-10-CM

## 2022-06-29 DIAGNOSIS — F5101 Primary insomnia: Secondary | ICD-10-CM

## 2022-06-29 DIAGNOSIS — R7303 Prediabetes: Secondary | ICD-10-CM

## 2022-06-29 DIAGNOSIS — F411 Generalized anxiety disorder: Secondary | ICD-10-CM

## 2022-06-29 DIAGNOSIS — Z1231 Encounter for screening mammogram for malignant neoplasm of breast: Secondary | ICD-10-CM

## 2022-06-29 DIAGNOSIS — J309 Allergic rhinitis, unspecified: Secondary | ICD-10-CM | POA: Insufficient documentation

## 2022-06-29 DIAGNOSIS — I493 Ventricular premature depolarization: Secondary | ICD-10-CM | POA: Insufficient documentation

## 2022-06-29 MED ORDER — ROSUVASTATIN CALCIUM 10 MG PO TABS
10.0000 mg | ORAL_TABLET | Freq: Every day | ORAL | 3 refills | Status: DC
Start: 2022-06-29 — End: 2023-07-10
  Filled 2022-06-29 – 2022-09-14 (×2): qty 90, 90d supply, fill #0
  Filled 2022-12-15: qty 90, 90d supply, fill #1
  Filled 2023-03-20: qty 90, 90d supply, fill #2

## 2022-06-29 MED ORDER — HYDROCHLOROTHIAZIDE 25 MG PO TABS
25.0000 mg | ORAL_TABLET | Freq: Every day | ORAL | 3 refills | Status: DC
Start: 2022-06-29 — End: 2023-07-10
  Filled 2022-06-29 – 2022-07-19 (×2): qty 90, 90d supply, fill #0
  Filled 2022-11-09: qty 90, 90d supply, fill #1
  Filled 2023-02-13: qty 90, 90d supply, fill #2
  Filled 2023-05-30: qty 90, 90d supply, fill #3

## 2022-06-29 NOTE — Progress Notes (Signed)
Judy Clamp, DNP, AGNP-c [facility]  [address] [place], [place] [address] [phone]  ESTABLISHED PATIENT- Chronic Health and/or Follow-Up Visit  Blood pressure 124/78, pulse 65, weight 205 lb 6.4 oz (93.2 kg), last menstrual period 06/26/2022.    Judy Foster is a 47 y.o. year old female presenting today for evaluation and management of chronic conditions.   HTN, PreDM, Mood, Vitamin D Def, Fatigue  Latecia presents today for follow-up of her hypertension, prediabetes, and vitamin D deficiency.  She also reports concerns with fatigue and insomnia.  Ronnisha reports her blood pressure is good.  She has taken her blood pressure medication today.  She does check her blood pressure routinely and finds it to be within normal limits.  She has stopped semaglutide for weight and prediabetes management and is not currently utilizing any medication.  She is monitoring her diet and trying to work in exercise, however, the increased time commitments and stress from work has caused her to not be as consistent with this as she has in the past. She is hopeful with additional time and settling into her new position she will develop a good routine.   She confirms her mood is stable and denies any exacerbations of anxiety or depression outside of normal increases from work and everyday life. She feels her current medication is effective and she does not feel the need for any changes or additional management.   Sheyanna expresses concerns with chronic tiredness, which she attributes to mental stress and long work hours with her new job.  She mentions feeling exhausted regardless of whether she sleeps well or not, suspecting a component of chronic fatigue may be present.  She does not feel she has sleep apnea; she does not feel that she snores.  She has tried trazodone in the past for sleep issues but found it made her groggy the next day.  Currently, she occasionally uses Xanax  to help her sleep but is hesitant to rely on it due to concerns about following day drowsiness and dependency.  She reports that vitamin D supplements have slightly improved her sensation of wellbeing.    All ROS negative with exception of what is listed above.   PHYSICAL EXAM Physical Exam Vitals and nursing note reviewed.  Constitutional:      General: She is not in acute distress.    Appearance: Normal appearance.  HENT:     Head: Normocephalic.  Eyes:     General: No scleral icterus.    Conjunctiva/sclera: Conjunctivae normal.     Pupils: Pupils are equal, round, and reactive to light.  Neck:     Vascular: No carotid bruit.  Cardiovascular:     Rate and Rhythm: Normal rate and regular rhythm.     Pulses: Normal pulses.     Heart sounds: Normal heart sounds. No murmur heard. Pulmonary:     Effort: Pulmonary effort is normal.     Breath sounds: Normal breath sounds. No wheezing.  Abdominal:     General: Bowel sounds are normal. There is no distension.     Palpations: Abdomen is soft.     Tenderness: There is no abdominal tenderness. There is no guarding.  Musculoskeletal:     Cervical back: Neck supple.     Right lower leg: No edema.     Left lower leg: No edema.  Skin:    General: Skin is warm and dry.     Capillary Refill: Capillary refill takes less than 2 seconds.  Neurological:     General: No focal deficit present.     Mental Status: She is alert and oriented to person, place, and time.  Psychiatric:        Mood and Affect: Mood normal.        Behavior: Behavior normal.    PLAN Problem List Items Addressed This Visit     PVC (premature ventricular contraction)   Relevant Medications   rosuvastatin (CRESTOR) 10 MG tablet   hydrochlorothiazide (HYDRODIURIL) 25 MG tablet   Other Relevant Orders   Hemoglobin A1c (Completed)   CBC with Differential/Platelet (Completed)   Comprehensive metabolic panel (Completed)   Lipid panel (Completed)   VITAMIN D 25  Hydroxy (Vit-D Deficiency, Fractures) (Completed)   Vitamin B12 (Completed)   Prediabetes   Relevant Medications   rosuvastatin (CRESTOR) 10 MG tablet   Other Relevant Orders   Hemoglobin A1c (Completed)   CBC with Differential/Platelet (Completed)   Comprehensive metabolic panel (Completed)   Lipid panel (Completed)   VITAMIN D 25 Hydroxy (Vit-D Deficiency, Fractures) (Completed)   Vitamin B12 (Completed)   Familial hypercholesterolemia   Relevant Medications   rosuvastatin (CRESTOR) 10 MG tablet   hydrochlorothiazide (HYDRODIURIL) 25 MG tablet   Other Relevant Orders   Hemoglobin A1c (Completed)   CBC with Differential/Platelet (Completed)   Comprehensive metabolic panel (Completed)   Lipid panel (Completed)   VITAMIN D 25 Hydroxy (Vit-D Deficiency, Fractures) (Completed)   Vitamin B12 (Completed)   Primary hypertension - Primary   Relevant Medications   rosuvastatin (CRESTOR) 10 MG tablet   hydrochlorothiazide (HYDRODIURIL) 25 MG tablet   Other Relevant Orders   Hemoglobin A1c (Completed)   CBC with Differential/Platelet (Completed)   Comprehensive metabolic panel (Completed)   Lipid panel (Completed)   VITAMIN D 25 Hydroxy (Vit-D Deficiency, Fractures) (Completed)   Vitamin B12 (Completed)   GAD (generalized anxiety disorder)   Relevant Orders   Hemoglobin A1c (Completed)   CBC with Differential/Platelet (Completed)   Comprehensive metabolic panel (Completed)   Lipid panel (Completed)   VITAMIN D 25 Hydroxy (Vit-D Deficiency, Fractures) (Completed)   Vitamin B12 (Completed)   Insomnia   Relevant Orders   Hemoglobin A1c (Completed)   CBC with Differential/Platelet (Completed)   Comprehensive metabolic panel (Completed)   Lipid panel (Completed)   VITAMIN D 25 Hydroxy (Vit-D Deficiency, Fractures) (Completed)   Vitamin B12 (Completed)   Chronic fatigue   Relevant Orders   Hemoglobin A1c (Completed)   CBC with Differential/Platelet (Completed)   Comprehensive  metabolic panel (Completed)   Lipid panel (Completed)   VITAMIN D 25 Hydroxy (Vit-D Deficiency, Fractures) (Completed)   Vitamin B12 (Completed)    No follow-ups on file.   Judy Clamp, DNP, AGNP-c 06/29/2022 10:31 AM

## 2022-07-08 DIAGNOSIS — R7303 Prediabetes: Secondary | ICD-10-CM | POA: Diagnosis not present

## 2022-07-08 DIAGNOSIS — I10 Essential (primary) hypertension: Secondary | ICD-10-CM | POA: Diagnosis not present

## 2022-07-08 DIAGNOSIS — E7801 Familial hypercholesterolemia: Secondary | ICD-10-CM | POA: Diagnosis not present

## 2022-07-08 DIAGNOSIS — F5101 Primary insomnia: Secondary | ICD-10-CM | POA: Diagnosis not present

## 2022-07-08 DIAGNOSIS — I493 Ventricular premature depolarization: Secondary | ICD-10-CM | POA: Diagnosis not present

## 2022-07-08 DIAGNOSIS — R5382 Chronic fatigue, unspecified: Secondary | ICD-10-CM | POA: Diagnosis not present

## 2022-07-08 DIAGNOSIS — F411 Generalized anxiety disorder: Secondary | ICD-10-CM | POA: Diagnosis not present

## 2022-07-08 LAB — VITAMIN B12

## 2022-07-09 LAB — CBC WITH DIFFERENTIAL/PLATELET
Basophils Absolute: 0 10*3/uL (ref 0.0–0.2)
Basos: 1 %
EOS (ABSOLUTE): 0.1 10*3/uL (ref 0.0–0.4)
Eos: 2 %
Hematocrit: 39 % (ref 34.0–46.6)
Hemoglobin: 13.6 g/dL (ref 11.1–15.9)
Immature Grans (Abs): 0 10*3/uL (ref 0.0–0.1)
Immature Granulocytes: 0 %
Lymphocytes Absolute: 2.7 10*3/uL (ref 0.7–3.1)
Lymphs: 38 %
MCH: 32.4 pg (ref 26.6–33.0)
MCHC: 34.9 g/dL (ref 31.5–35.7)
MCV: 93 fL (ref 79–97)
Monocytes Absolute: 0.4 10*3/uL (ref 0.1–0.9)
Monocytes: 6 %
Neutrophils Absolute: 3.8 10*3/uL (ref 1.4–7.0)
Neutrophils: 53 %
Platelets: 311 10*3/uL (ref 150–450)
RBC: 4.2 x10E6/uL (ref 3.77–5.28)
RDW: 12.4 % (ref 11.7–15.4)
WBC: 7.1 10*3/uL (ref 3.4–10.8)

## 2022-07-09 LAB — COMPREHENSIVE METABOLIC PANEL
ALT: 33 IU/L — ABNORMAL HIGH (ref 0–32)
AST: 25 IU/L (ref 0–40)
Albumin/Globulin Ratio: 2 (ref 1.2–2.2)
Albumin: 4.7 g/dL (ref 3.9–4.9)
Alkaline Phosphatase: 51 IU/L (ref 44–121)
BUN/Creatinine Ratio: 11 (ref 9–23)
BUN: 9 mg/dL (ref 6–24)
Bilirubin Total: 0.4 mg/dL (ref 0.0–1.2)
CO2: 28 mmol/L (ref 20–29)
Calcium: 9.3 mg/dL (ref 8.7–10.2)
Chloride: 97 mmol/L (ref 96–106)
Creatinine, Ser: 0.85 mg/dL (ref 0.57–1.00)
Globulin, Total: 2.3 g/dL (ref 1.5–4.5)
Glucose: 98 mg/dL (ref 70–99)
Potassium: 3.3 mmol/L — ABNORMAL LOW (ref 3.5–5.2)
Sodium: 139 mmol/L (ref 134–144)
Total Protein: 7 g/dL (ref 6.0–8.5)
eGFR: 87 mL/min/{1.73_m2} (ref >59)

## 2022-07-09 LAB — HEMOGLOBIN A1C
Est. average glucose Bld gHb Est-mCnc: 123 mg/dL
Hgb A1c MFr Bld: 5.9 % — ABNORMAL HIGH (ref 4.8–5.6)

## 2022-07-09 LAB — VITAMIN D 25 HYDROXY (VIT D DEFICIENCY, FRACTURES): Vit D, 25-Hydroxy: 43.4 ng/mL (ref 30.0–100.0)

## 2022-07-09 LAB — LIPID PANEL
Chol/HDL Ratio: 3.5 ratio (ref 0.0–4.4)
Cholesterol, Total: 168 mg/dL (ref 100–199)
HDL: 48 mg/dL (ref >39)
LDL Chol Calc (NIH): 91 mg/dL (ref 0–99)
Triglycerides: 171 mg/dL — ABNORMAL HIGH (ref 0–149)
VLDL Cholesterol Cal: 29 mg/dL (ref 5–40)

## 2022-07-18 NOTE — Assessment & Plan Note (Signed)
Fatigue continues despite control of depressive symptoms and adequate sleep. She does have concerns with increased stress with a new position, which may be contributing. Additionally, chronic vitamin D deficiency and lack of routine activity may also be contributing.  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  - Trial

## 2022-07-19 ENCOUNTER — Other Ambulatory Visit: Payer: Self-pay

## 2022-07-19 ENCOUNTER — Other Ambulatory Visit (HOSPITAL_COMMUNITY): Payer: Self-pay

## 2022-07-19 NOTE — Assessment & Plan Note (Signed)
Concerns with sleep disturbance.  Judy Foster has tried trazodone in the past which resulted in next-day grogginess.  She is currently using low-dose Xanax which is effective however she does express concerns about dependence. Plan: - Consider use of extended release melatonin as alternative to Xanax for routine use -If symptoms persist we can evaluate sleep medications that may be effective.

## 2022-07-19 NOTE — Assessment & Plan Note (Signed)
Chronic hypertension with good control.  She is taking her blood pressure medicines routinely and monitoring her blood pressures at home which are within the normal limits.  Currently managed with hydrochlorothiazide 25 mg daily. Plan: - Labs pending today. - Continue current medication - Notify the office if you notice that your blood pressure readings are elevating above 130/80 on a consistent/routine basis.

## 2022-07-19 NOTE — Assessment & Plan Note (Signed)
History of hyperlipidemia currently on rosuvastatin 10 mg daily.  Patient is tolerating well with no adverse side effects noted. Plan: - Labs pending today for evaluation - Continue current dosage of rosuvastatin. - Refills provided for 1 year.

## 2022-07-19 NOTE — Assessment & Plan Note (Signed)
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  No alarm symptoms are present at this time Plan: - Continue to monitor closely for palpitations or arrhythmias and notify immediately.

## 2022-07-19 NOTE — Assessment & Plan Note (Signed)
Chronic anxiety under good control with Lexapro 10 mg daily and as needed alprazolam 0.5 mg.  Slight increase in stress levels due to new job however these do not appear to be negatively affect health status at this time.  She is followed by psychiatry for management. Plan: - Continue current medications. - Notify your mental health provider or myself immediately if you notice new or worsening symptoms so these can be addressed immediately.

## 2022-07-19 NOTE — Assessment & Plan Note (Signed)
History of prediabetes with good control while on semaglutide for weight management.  She has been off of this medication for several months at this time.  She is working on diet and exercise however there has been some difficulty with this due to her work schedule and increased stressors.  I do suspect that her A1c will be slightly elevated today. Plan: - Labs pending today. - Continue to work on diet and exercise.  15 to 20-minute walk daily can be exceptionally helpful for both weight and stress levels. - We will consider medication management if it appears your A1c has increased to the diabetes range

## 2022-08-06 ENCOUNTER — Other Ambulatory Visit (HOSPITAL_COMMUNITY): Payer: Self-pay

## 2022-08-30 ENCOUNTER — Telehealth: Payer: 59 | Admitting: Nurse Practitioner

## 2022-08-30 ENCOUNTER — Other Ambulatory Visit (HOSPITAL_COMMUNITY): Payer: Self-pay

## 2022-08-30 DIAGNOSIS — M545 Low back pain, unspecified: Secondary | ICD-10-CM

## 2022-08-30 MED ORDER — PREDNISONE 10 MG (21) PO TBPK
ORAL_TABLET | ORAL | 0 refills | Status: DC
Start: 2022-08-30 — End: 2022-10-05
  Filled 2022-08-30: qty 21, 6d supply, fill #0

## 2022-08-30 NOTE — Progress Notes (Signed)
We are sorry that you are not feeling well.  Here is how we plan to help!  Based on what you have shared with me it looks like you mostly have acute back pain.  Acute back pain is defined as musculoskeletal pain that can resolve in 1-3 weeks with conservative treatment.  I have prescribed  Meds ordered this encounter  Medications   predniSONE (STERAPRED UNI-PAK 21 TAB) 10 MG (21) TBPK tablet    Sig: Take 6 tablets on day one, 5 on day two, 4 on day three, 3 on day four, 2 on day five, and 1 on day six. Take with food.    Dispense:  21 tablet    Refill:  0     Some patients experience stomach irritation or in increased heartburn with anti-inflammatory drugs.  Please keep in mind that muscle relaxer's can cause fatigue and should not be taken while at work or driving.  Back pain is very common.  The pain often gets better over time.  The cause of back pain is usually not dangerous.  Most people can learn to manage their back pain on their own.  Home Care Stay active.  Start with short walks on flat ground if you can.  Try to walk farther each day. Do not sit, drive or stand in one place for more than 30 minutes.  Do not stay in bed. Do not avoid exercise or work.  Activity can help your back heal faster. Be careful when you bend or lift an object.  Bend at your knees, keep the object close to you, and do not twist. Sleep on a firm mattress.  Lie on your side, and bend your knees.  If you lie on your back, put a pillow under your knees. Only take medicines as told by your doctor. Put ice on the injured area. Put ice in a plastic bag Place a towel between your skin and the bag Leave the ice on for 15-20 minutes, 3-4 times a day for the first 2-3 days. 210 After that, you can switch between ice and heat packs. Ask your doctor about back exercises or massage. Avoid feeling anxious or stressed.  Find good ways to deal with stress, such as exercise.  Get Help Right Way If: Your pain does not go  away with rest or medicine. Your pain does not go away in 1 week. You have new problems. You do not feel well. The pain spreads into your legs. You cannot control when you poop (bowel movement) or pee (urinate) You feel sick to your stomach (nauseous) or throw up (vomit) You have belly (abdominal) pain. You feel like you may pass out (faint). If you develop a fever.  Make Sure you: Understand these instructions. Will watch your condition Will get help right away if you are not doing well or get worse.  Your e-visit answers were reviewed by a board certified advanced clinical practitioner to complete your personal care plan.  Depending on the condition, your plan could have included both over the counter or prescription medications.  If there is a problem please reply  once you have received a response from your provider.  Your safety is important to [place].  If you have drug allergies check your prescription carefully.    You can use MyChart to ask questions about today's visit, request a non-urgent call back, or ask for a work or school excuse for 24 hours related to this e-Visit. If it has been greater  than 24 hours you will need to follow up with your provider, or enter a new e-Visit to address those concerns.  You will get an e-mail in the next two days asking about your experience.  I hope that your e-visit has been valuable and will speed your recovery. Thank you for using e-visits.   I spent approximately 5 minutes reviewing the patient's history, current symptoms and coordinating their care today.

## 2022-09-01 ENCOUNTER — Ambulatory Visit (HOSPITAL_BASED_OUTPATIENT_CLINIC_OR_DEPARTMENT_OTHER): Payer: 59 | Admitting: Obstetrics & Gynecology

## 2022-09-15 ENCOUNTER — Other Ambulatory Visit: Payer: Self-pay

## 2022-09-15 ENCOUNTER — Other Ambulatory Visit (HOSPITAL_COMMUNITY): Payer: Self-pay

## 2022-09-19 ENCOUNTER — Encounter: Payer: Self-pay | Admitting: Nurse Practitioner

## 2022-10-05 ENCOUNTER — Other Ambulatory Visit (HOSPITAL_COMMUNITY)
Admission: RE | Admit: 2022-10-05 | Discharge: 2022-10-05 | Disposition: A | Discharge status: Home / Self Care | Payer: 59 | Source: Ambulatory Visit | Attending: Obstetrics & Gynecology | Admitting: Obstetrics & Gynecology

## 2022-10-05 ENCOUNTER — Ambulatory Visit (INDEPENDENT_AMBULATORY_CARE_PROVIDER_SITE_OTHER): Payer: 59 | Admitting: Obstetrics & Gynecology

## 2022-10-05 ENCOUNTER — Encounter (HOSPITAL_BASED_OUTPATIENT_CLINIC_OR_DEPARTMENT_OTHER): Payer: Self-pay | Admitting: Obstetrics & Gynecology

## 2022-10-05 VITALS — BP 130/74 | HR 75 | Ht 68.5 in | Wt 210.2 lb

## 2022-10-05 DIAGNOSIS — M545 Low back pain, unspecified: Secondary | ICD-10-CM | POA: Diagnosis not present

## 2022-10-05 DIAGNOSIS — Z01419 Encounter for gynecological examination (general) (routine) without abnormal findings: Secondary | ICD-10-CM | POA: Diagnosis not present

## 2022-10-05 DIAGNOSIS — R87610 Atypical squamous cells of undetermined significance on cytologic smear of cervix (ASC-US): Secondary | ICD-10-CM

## 2022-10-05 DIAGNOSIS — Z1211 Encounter for screening for malignant neoplasm of colon: Secondary | ICD-10-CM | POA: Diagnosis not present

## 2022-10-05 NOTE — Progress Notes (Signed)
47 y.o. [id number] Married White female here for gyn wellness exam.  Doing well.  Was on Ozympic last year but then insurance wouldn't cover it.  She has gained some of it back.  Having some back pain issues.  Did take steroid pack and this helped about 60% but has worsened again.    Mother with hx of breast cancer.  Tyrer Cusick model was done last year with 16% lifetime risk.  She is doing yearly mammogram.  Cycles are regular and about 3-4 days.  She does not have heavy days.   Patient's last menstrual period was 09/14/2022 (approximate).          Sexually active: No.    Upstream - 10/05/22 0815       Pregnancy Intention Screening   Does the patient want to become pregnant in the next year? No    Does the patient's partner want to become pregnant in the next year? No    Would the patient like to discuss contraceptive options today? No      Contraception Wrap Up   Current Method Vasectomy            Exercising: No Smoker: No  Health Maintenance: Pap:  08/2022 with neg HR HPV History of abnormal Pap:  h/o ASCUS with neg HR HPV MMG:  06/2022 Colonoscopy:  due and referral placed  Screening Labs: done with PCP   reports that she has never smoked. She has never used smokeless tobacco. She reports that she does not drink alcohol and does not use drugs.  Past Medical History:  Diagnosis Date   Anal fissure    Anxiety    Asthma, exercise induced    as a teen   Cardiac arrhythmia    Depression    Dysmenorrhea    past   HLD (hyperlipidemia)    Hyperlipidemia    Mononucleosis 1993   Palpitations 10/15/2009   Qualifier: Diagnosis of  By: Denyse Amass CMA, Dema Severin     Past Surgical History:  Procedure Laterality Date   CRANIECTOMY SUBOCCIPITAL W/ CERVICAL LAMINECTOMY / CHIARI  2006   DILATATION & CURETTAGE/HYSTEROSCOPY WITH MYOSURE N/A 10/13/2020   Procedure: DILATATION & CURETTAGE/HYSTEROSCOPY WITH MYOSURE;  Surgeon: Jerene Bears, MD;  Location: [facility] OR;  Service:  Gynecology;  Laterality: N/A;    Current Outpatient Medications  Medication Sig Dispense Refill   ALPRAZolam (XANAX) 0.5 MG tablet Take 1 tablet by mouth 2 times daily as needed for anxiety. 180 tablet 0   cholecalciferol (VITAMIN D3) 25 MCG (1000 UNIT) tablet Take 1,000 Units by mouth daily.     escitalopram (LEXAPRO) 10 MG tablet Take 1 tablet (10 mg total) by mouth daily. 90 tablet 1   hydrochlorothiazide (HYDRODIURIL) 25 MG tablet Take 1 tablet (25 mg total) by mouth daily. 90 tablet 3   rosuvastatin (CRESTOR) 10 MG tablet Take 1 tablet (10 mg total) by mouth at bedtime. 90 tablet 3   No current facility-administered medications for this visit.    Family History  Problem Relation Age of Onset   Breast cancer Mother 13   Hypertension Mother    Thyroid disease Mother        hypothyroid   Colon polyps Mother    Diabetes Mother    Colon cancer Maternal Grandmother    Thyroid disease Maternal Grandmother    Stroke Maternal Grandmother    Kidney disease Maternal Grandmother    Leukemia Maternal Grandfather    Heart disease Maternal  Grandfather     ROS: Constitutional: negative Genitourinary:negative  Exam:   BP (!) 132/94 (BP Location: Left Arm, Patient Position: Sitting, Cuff Size: Normal)   Pulse 75   Ht 5' 8.5" (1.74 m)   Wt 210 lb 3.2 oz (95.3 kg)   LMP 09/14/2022 (Approximate)   BMI 31.50 kg/m   Height: 5' 8.5" (174 cm)  General appearance: alert, cooperative and appears stated age Head: Normocephalic, without obvious abnormality, atraumatic Neck: no adenopathy, supple, symmetrical, trachea midline and thyroid normal to inspection and palpation Lungs: clear to auscultation bilaterally Breasts: normal appearance, no masses or tenderness Heart: regular rate and rhythm Abdomen: soft, non-tender; bowel sounds normal; no masses,  no organomegaly Extremities: extremities normal, atraumatic, no cyanosis or edema Skin: Skin color, texture, turgor normal. No rashes or  lesions Lymph nodes: Cervical, supraclavicular, and axillary nodes normal. No abnormal inguinal nodes palpated Neurologic: Grossly normal   Pelvic: External genitalia:  no lesions              Urethra:  normal appearing urethra with no masses, tenderness or lesions              Bartholins and Skenes: normal                 Vagina: normal appearing vagina with normal color and no discharge, no lesions              Cervix: no lesions              Pap taken: Yes.   Bimanual Exam:  Uterus:  normal size, contour, position, consistency, mobility, non-tender              Adnexa: normal adnexa and no mass, fullness, tenderness               Rectovaginal: Confirms               Anus:  normal sphincter tone, no lesions  Chaperone, Hendricks Milo, CMA, was present for exam.  Assessment/Plan: 1. Well woman exam with routine gynecological exam - Pap smear obtained today per pt request - Mammogram up to date - Colonoscopy referral placed - lab work done with PCP - vaccines reviewed/updated  2. Acute midline low back pain without sciatica - AMB referral to sports medicine  3. ASCUS of cervix with negative high risk HPV - Cytology - PAP( [facility])  4. Colon cancer screening - Ambulatory referral to Gastroenterology

## 2022-10-12 LAB — CYTOLOGY - PAP
Diagnosis: NEGATIVE
Diagnosis: REACTIVE

## 2022-10-23 ENCOUNTER — Telehealth: Payer: Self-pay | Admitting: Nurse Practitioner

## 2022-10-23 NOTE — Telephone Encounter (Signed)
P.A. OZEMPIC received, med discontinued

## 2022-11-16 ENCOUNTER — Encounter: Payer: Self-pay | Admitting: Physician Assistant

## 2022-11-16 ENCOUNTER — Other Ambulatory Visit (HOSPITAL_COMMUNITY): Payer: Self-pay

## 2022-11-16 ENCOUNTER — Ambulatory Visit: Payer: 59 | Admitting: Physician Assistant

## 2022-11-16 DIAGNOSIS — F3342 Major depressive disorder, recurrent, in full remission: Secondary | ICD-10-CM | POA: Diagnosis not present

## 2022-11-16 DIAGNOSIS — F3281 Premenstrual dysphoric disorder: Secondary | ICD-10-CM

## 2022-11-16 DIAGNOSIS — G4700 Insomnia, unspecified: Secondary | ICD-10-CM | POA: Diagnosis not present

## 2022-11-16 DIAGNOSIS — F411 Generalized anxiety disorder: Secondary | ICD-10-CM

## 2022-11-16 MED ORDER — ESCITALOPRAM OXALATE 10 MG PO TABS
10.0000 mg | ORAL_TABLET | Freq: Every day | ORAL | 1 refills | Status: DC
  Filled 2022-11-16 – 2022-12-15 (×2): qty 105, 90d supply, fill #0

## 2022-11-16 MED ORDER — ALPRAZOLAM 0.5 MG PO TABS
0.5000 mg | ORAL_TABLET | Freq: Two times a day (BID) | ORAL | 0 refills | Status: DC | PRN
  Filled 2022-11-16 – 2023-01-15 (×2): qty 180, 90d supply, fill #0

## 2022-11-16 NOTE — Progress Notes (Signed)
[facility]  Patient ID: Judy Foster,  MRN: [id number]  PCP: Tollie Eth, NP  Date of Evaluation: 11/16/2022 Time spent:20 minutes  Chief Complaint:  Chief Complaint   Anxiety; Depression; Follow-up    HISTORY/CURRENT STATUS: HPI for routine med check.  Is doing well w/ current meds. Anxiety is well controlled. Not needing the Xanax during the day but only takes it at night along with the melatonin so she can go to sleep.  If she does not take it she is not able to go to sleep or stay asleep.  Has noticed worsening mood before her periods.  She gets more irritable and sensitive a couple of days before menses begins and within a day after she starts her mood goes back to baseline normal.  This has been going on for years but she has noticed it more so in the past 4 or 5 months.  Patient is able to enjoy things.  Energy and motivation are good.  Work is going well.  She is an NP in cardiology.  Still learning but is enjoying it.  No extreme sadness, tearfulness, or feelings of hopelessness.  ADLs and personal hygiene are normal.   Denies any changes in concentration, making decisions, or remembering things.  Appetite has not changed.  Weight is stable.  Denies suicidal or homicidal thoughts.  Patient denies increased energy with decreased need for sleep, increased talkativeness, racing thoughts, impulsivity or risky behaviors, increased spending, increased libido, grandiosity, increased irritability or anger, paranoia, or hallucinations.  Denies dizziness, syncope, seizures, numbness, tingling, tremor, tics, unsteady gait, slurred speech, confusion. Denies muscle or joint pain, stiffness, or dystonia.  Individual Medical History/ Review of Systems: Changes? :No     Past medications for mental health diagnoses include: Prozac caused drowiness, Wellbutrin, Zoloft seemed to work for [employer], trazodone, Melatonin, Viibryd caused headaches, Lexapro, Xanax, Hydroxyzine for    Allergies: Penicillins  Current Medications:  Current Outpatient Medications:    cholecalciferol (VITAMIN D3) 25 MCG (1000 UNIT) tablet, Take 1,000 Units by mouth daily., Disp: , Rfl:    hydrochlorothiazide (HYDRODIURIL) 25 MG tablet, Take 1 tablet (25 mg total) by mouth daily., Disp: 90 tablet, Rfl: 3   rosuvastatin (CRESTOR) 10 MG tablet, Take 1 tablet (10 mg total) by mouth at bedtime., Disp: 90 tablet, Rfl: 3   ALPRAZolam (XANAX) 0.5 MG tablet, Take 1 tablet by mouth 2 times daily as needed for anxiety., Disp: 180 tablet, Rfl: 0   escitalopram (LEXAPRO) 10 MG tablet, Take 1 tablet (10 mg total) by mouth daily. But increase to 15 mg daily starting 5 days before menses, then go back to 10 mg once menses begins., Disp: 105 tablet, Rfl: 1 Medication Side Effects: none  Family Medical/ Social History: Changes?  No  MENTAL HEALTH EXAM:  There were no vitals taken for this visit.There is no height or weight on file to calculate BMI.  General Appearance: Casual, Neat and Well Groomed  Eye Contact:  Good  Speech:  Clear and Coherent and Normal Rate  Volume:  Normal  Mood:  Euthymic  Affect:  Congruent  Thought Process:  Goal Directed and Descriptions of Associations: Circumstantial  Orientation:  Full (Time, Place, and Person)  Thought Content: Logical   Suicidal Thoughts:  No  Homicidal Thoughts:  No  Memory:  WNL  Judgement:  Good  Insight:  Good  Psychomotor Activity:  Normal  Concentration:  Concentration: Good and Attention Span: Good  Recall:  Good  Fund of  Knowledge: Good  Language: Good  Assets:  Communication Skills Desire for Improvement Financial Resources/Insurance Housing Resilience Transportation Vocational/Educational  ADL's:  Intact  Cognition: WNL  Prognosis:  Good   DIAGNOSES:    ICD-10-CM   1. Recurrent major depressive disorder, in full remission (HCC)  F33.42     2. Insomnia, unspecified type  G47.00     3. Generalized anxiety disorder  F41.1      4. PMDD (premenstrual dysphoric disorder)  F32.81      Receiving Psychotherapy: No   RECOMMENDATIONS:  PDMP was reviewed.  Last Xanax filled 05/18/2022. I provided 20 minutes of face to face time during this encounter, including time spent before and after the visit in records review, medical decision making, counseling pertinent to today's visit, and charting.   Discussed the PMDD.  We can increase the Lexapro to 15 mg for around 5 days before menses.  Then she will go back to 10 mg once her cycle starts.  Explained the rationale.  We could do Zoloft 25 mg instead of increasing the Lexapro but she prefers trying the Lexapro first.  Continue Xanax 0.5 mg, 1 p.o. twice daily as needed. Continue Lexapro 10 mg, 1 p.o. daily.  Increase to 15 mg, 5 days before menses begins, then decrease to 10 mg after menses starts. Return in 4 months.  Judy Overly, PA-C

## 2022-11-28 ENCOUNTER — Telehealth: Payer: Self-pay | Admitting: Physician Assistant

## 2022-11-28 ENCOUNTER — Other Ambulatory Visit (HOSPITAL_COMMUNITY): Payer: Self-pay

## 2022-11-28 NOTE — Telephone Encounter (Signed)
Judy Foster called in at 1:50 and left message. She would like to increase her Lexapro dose to 20mg . She may be hard to reach, but can LM on voice mail or send a message thru mychart.

## 2022-11-29 NOTE — Telephone Encounter (Signed)
LVM to Judy Foster. Why does she feel she needs to increase dose and is it just for PMDD that she needs the increase.

## 2022-12-01 NOTE — Telephone Encounter (Signed)
Lvm rtc

## 2022-12-01 NOTE — Telephone Encounter (Signed)
Please confirm her daily dose. She should be taking 10 mg daily but increasing to 15 mg daily for the 5 days before menses begins.  If she is taking the 15 mg daily then it is okay to increase to 20 mg.  If she is only taking the 10 mg daily, increased to 15 mg daily.  We can send in a prescription for the 20 mg if that is the change we will make.  Please Pind for me if so.  Thank you.

## 2022-12-01 NOTE — Telephone Encounter (Signed)
Pt lvm that she feels low and feels that the increase would help her. She said that she discussed this with teresa. Please call her and lvm if the increase to 20 mg is ok at [phone]

## 2022-12-05 NOTE — Telephone Encounter (Signed)
Lvm rtc

## 2022-12-06 NOTE — Telephone Encounter (Signed)
LVMRTC

## 2022-12-16 ENCOUNTER — Other Ambulatory Visit: Payer: Self-pay

## 2022-12-16 ENCOUNTER — Other Ambulatory Visit (HOSPITAL_COMMUNITY): Payer: Self-pay

## 2023-01-16 ENCOUNTER — Other Ambulatory Visit: Payer: Self-pay

## 2023-03-15 ENCOUNTER — Ambulatory Visit: Payer: 59 | Admitting: Physician Assistant

## 2023-03-15 ENCOUNTER — Encounter: Payer: Self-pay | Admitting: Physician Assistant

## 2023-03-15 ENCOUNTER — Other Ambulatory Visit (HOSPITAL_COMMUNITY): Payer: Self-pay

## 2023-03-15 DIAGNOSIS — F3281 Premenstrual dysphoric disorder: Secondary | ICD-10-CM

## 2023-03-15 DIAGNOSIS — F411 Generalized anxiety disorder: Secondary | ICD-10-CM | POA: Diagnosis not present

## 2023-03-15 DIAGNOSIS — F331 Major depressive disorder, recurrent, moderate: Secondary | ICD-10-CM

## 2023-03-15 MED ORDER — ESCITALOPRAM OXALATE 10 MG PO TABS
15.0000 mg | ORAL_TABLET | Freq: Every day | ORAL | 1 refills | Status: DC
  Filled 2023-03-15: qty 150, 100d supply, fill #0

## 2023-03-15 MED ORDER — ALPRAZOLAM 0.5 MG PO TABS
0.5000 mg | ORAL_TABLET | Freq: Two times a day (BID) | ORAL | 0 refills | Status: DC | PRN
  Filled 2023-03-15: qty 180, 90d supply, fill #0

## 2023-03-15 NOTE — Progress Notes (Signed)
[facility]  Patient ID: RIANNON DEGARMO,  MRN: [id number]  PCP: Tollie Eth, NP  Date of Evaluation: 03/15/2023 Time spent:20 minutes  Chief Complaint:  Chief Complaint   Depression; Anxiety; Follow-up    HISTORY/CURRENT STATUS: HPI for routine med check.  We planned to increase the Lexapro before menses, but she 'misses the cues' and does not take it on time. PMS is still very bad.  Has a hard time enjoying things in general though.  Doesn't want to do anything. She cancels plans or doesn't make any as a general rule. 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  She does not miss work but states she has all she can give at work and then cannot do anymore after that.  ADLs and personal hygiene are normal.  Appetite is normal and weight is stable.  She is sleeping well and feels rested when she gets up.  Does not cry easily.  She is able to focus and get things done in a timely manner.  No problems with memory.  No suicidal or homicidal thoughts.  She does get anxious at times but it is usually triggered.  Not having panic attacks but more of a sense of unease and gets overwhelmed easily.  Patient denies increased energy with decreased need for sleep, increased talkativeness, racing thoughts, impulsivity or risky behaviors, increased spending, increased libido, grandiosity, increased irritability or anger, paranoia, or hallucinations.  Denies dizziness, syncope, seizures, numbness, tingling, tremor, tics, unsteady gait, slurred speech, confusion. Denies muscle or joint pain, stiffness, or dystonia.  Individual Medical History/ Review of Systems: Changes? :No     Past medications for mental health diagnoses include: Prozac caused drowiness, Wellbutrin, Zoloft seemed to work for [employer], trazodone, Melatonin, Viibryd caused headaches, Lexapro, Xanax, Hydroxyzine for   Allergies: Penicillins  Current Medications:  Current Outpatient  Medications:    cholecalciferol (VITAMIN D3) 25 MCG (1000 UNIT) tablet, Take 1,000 Units by mouth daily., Disp: , Rfl:    hydrochlorothiazide (HYDRODIURIL) 25 MG tablet, Take 1 tablet (25 mg total) by mouth daily., Disp: 90 tablet, Rfl: 3   rosuvastatin (CRESTOR) 10 MG tablet, Take 1 tablet (10 mg total) by mouth at bedtime., Disp: 90 tablet, Rfl: 3   ALPRAZolam (XANAX) 0.5 MG tablet, Take 1 tablet by mouth 2 times daily as needed for anxiety., Disp: 180 tablet, Rfl: 0   escitalopram (LEXAPRO) 10 MG tablet, Take 1.5 tablets (15 mg total) by mouth daily. But increase to 20 mg daily starting 5 days before menses, then go back to 15 mg once menses begins., Disp: 150 tablet, Rfl: 1 Medication Side Effects: none  Family Medical/ Social History: Changes?  No  MENTAL HEALTH EXAM:  There were no vitals taken for this visit.There is no height or weight on file to calculate BMI.  General Appearance: Casual, Neat and Well Groomed  Eye Contact:  Good  Speech:  Clear and Coherent and Normal Rate  Volume:  Normal  Mood:  Euthymic  Affect:  Congruent  Thought Process:  Goal Directed and Descriptions of Associations: Circumstantial  Orientation:  Full (Time, Place, and Person)  Thought Content: Logical   Suicidal Thoughts:  No  Homicidal Thoughts:  No  Memory:  WNL  Judgement:  Good  Insight:  Good  Psychomotor Activity:  Normal  Concentration:  Concentration: Good and Attention Span: Good  Recall:  Good  Fund of Knowledge: Good  Language: Good  Assets:  Communication Skills Desire for Improvement Financial Resources/Insurance Housing Physical Health Resilience Transportation Vocational/Educational  ADL's:  Intact  Cognition: WNL  Prognosis:  Good   DIAGNOSES:    ICD-10-CM   1. Major depressive disorder, recurrent episode, moderate (HCC)  F33.1     2. PMDD (premenstrual dysphoric disorder)  F32.81     3. Generalized anxiety disorder  F41.1       Receiving Psychotherapy: No    RECOMMENDATIONS:  PDMP was reviewed.  Last Xanax filled 01/16/2023. I provided 20 minutes of face to face time during this encounter, including time spent before and after the visit in records review, medical decision making, counseling pertinent to today's visit, and charting.   We discussed her symptoms.  I recommend increasing the Lexapro daily but I have asked her to keep track of her menses so that she can take the higher dose of Lexapro the 5 days or so before her menses begins.  She is pretty regular so hopefully that will be a problem.  Continue Xanax 0.5 mg, 1 p.o. twice daily as needed. Increase Lexapro to 15 mg daily but increase to 20 mg daily for 5 days before her menses and then go back to 15 mg once menses begins.  Return in 2-3 months.  Melony Overly, PA-C

## 2023-03-20 ENCOUNTER — Other Ambulatory Visit (HOSPITAL_COMMUNITY): Payer: Self-pay

## 2023-03-24 ENCOUNTER — Other Ambulatory Visit (HOSPITAL_COMMUNITY): Payer: Self-pay

## 2023-04-01 ENCOUNTER — Ambulatory Visit (HOSPITAL_BASED_OUTPATIENT_CLINIC_OR_DEPARTMENT_OTHER)
Admission: RE | Admit: 2023-04-01 | Discharge: 2023-04-01 | Disposition: A | Discharge status: Home / Self Care | Payer: 59 | Source: Ambulatory Visit | Attending: Family Medicine | Admitting: Family Medicine

## 2023-04-01 ENCOUNTER — Encounter (HOSPITAL_BASED_OUTPATIENT_CLINIC_OR_DEPARTMENT_OTHER): Payer: Self-pay

## 2023-04-01 VITALS — BP 119/82 | HR 100 | Temp 99.8°F | Resp 18 | Ht 69.0 in | Wt 215.0 lb

## 2023-04-01 DIAGNOSIS — R509 Fever, unspecified: Secondary | ICD-10-CM

## 2023-04-01 DIAGNOSIS — J208 Acute bronchitis due to other specified organisms: Secondary | ICD-10-CM

## 2023-04-01 DIAGNOSIS — J101 Influenza due to other identified influenza virus with other respiratory manifestations: Secondary | ICD-10-CM

## 2023-04-01 DIAGNOSIS — R051 Acute cough: Secondary | ICD-10-CM

## 2023-04-01 LAB — POC COVID19/FLU A&B COMBO
Covid Antigen, POC: NEGATIVE
Influenza A Antigen, POC: POSITIVE — AB
Influenza B Antigen, POC: NEGATIVE

## 2023-04-01 MED ORDER — METHYLPREDNISOLONE ACETATE 40 MG/ML IJ SUSP
40.0000 mg | Freq: Once | INTRAMUSCULAR | Status: AC
  Administered 2023-04-01: 40 mg via INTRAMUSCULAR

## 2023-04-01 MED ORDER — LEVALBUTEROL TARTRATE 45 MCG/ACT IN AERO: 2.0000 | INHALATION_SPRAY | Freq: Four times a day (QID) | RESPIRATORY_TRACT | 0 refills | Status: AC | PRN

## 2023-04-01 MED ORDER — COMPACT SPACE CHAMBER DEVI: 0 refills | Status: AC

## 2023-04-01 MED ORDER — PROMETHAZINE-DM 6.25-15 MG/5ML PO SYRP: 5.0000 mL | ORAL_SOLUTION | Freq: Four times a day (QID) | ORAL | 0 refills | Status: DC | PRN

## 2023-04-01 NOTE — ED Provider Notes (Signed)
 [facility]    CSN: [id number] Arrival date & time: 04/01/23  0908      History   Chief Complaint Chief Complaint  Patient presents with   Chills    Entered by patient    HPI Judy Foster is a 48 y.o. female.   Patient reports sore throat and bodyaches that started Tuesday evening, to 03/28/23.  On Friday she developed fevers.  During the week she has had headaches, congestion, cough.  She is feels like she has been run over by a truck or something and really feels sick.  Patient has taken Dayquil, Nyquil, Ibuprofen , saline spray and Flonase.     Past Medical History:  Diagnosis Date   Anal fissure    Anxiety    Asthma, exercise induced    as a teen   Cardiac arrhythmia    Depression    Dysmenorrhea    past   HLD (hyperlipidemia)    Hyperlipidemia    Mononucleosis 1993   Palpitations 10/15/2009   Qualifier: Diagnosis of  By: Wynetta REYNOLDS Ivanoff     Shingles     Patient Active Problem List   Diagnosis Date Noted   PVC (premature ventricular contraction) 06/29/2022   Allergic rhinitis 06/29/2022   Ventricular premature depolarization 02/18/2021   Vitamin D  deficiency 11/23/2020   Familial hypercholesterolemia 11/19/2020   Chronic fatigue 11/19/2020   Primary hypertension 11/19/2020   Prediabetes 03/26/2020   Relies on partner vasectomy for contraception 03/25/2020   GAD (generalized anxiety disorder) 11/04/2017   MDD (major depressive disorder), recurrent, in partial remission (HCC) 11/04/2017   OCD (obsessive compulsive disorder) 11/04/2017   Insomnia 11/04/2017   Plantar fasciitis of right foot 02/26/2016   Congenital pes cavus 02/26/2016   Premenstrual symptom 05/17/2012   EXERCISE INDUCED ASTHMA 10/15/2009    Past Surgical History:  Procedure Laterality Date   CRANIECTOMY SUBOCCIPITAL W/ CERVICAL LAMINECTOMY / CHIARI  2006   DILATATION & CURETTAGE/HYSTEROSCOPY WITH MYOSURE N/A 10/13/2020   Procedure: DILATATION & CURETTAGE/HYSTEROSCOPY WITH  MYOSURE;  Surgeon: Cleotilde Ronal RAMAN, MD;  Location: [facility] OR;  Service: Gynecology;  Laterality: N/A;    OB History     Gravida  3   Para  2   Term      Preterm      AB  1   Living  2      SAB  1   IAB      Ectopic      Multiple      Live Births  2            Home Medications    Prior to Admission medications   Medication Sig Start Date End Date Taking? Authorizing Provider  ALPRAZolam  (XANAX ) 0.5 MG tablet Take 1 tablet by mouth 2 times daily as needed for anxiety. 03/15/23  Yes Rhys Boyer T, PA-C  cholecalciferol (VITAMIN D3) 25 MCG (1000 UNIT) tablet Take 1,000 Units by mouth daily.   Yes [provider]  escitalopram  (LEXAPRO ) 10 MG tablet Take 1.5 tablets (15 mg total) by mouth daily. But increase to 20 mg daily starting 5 days before menses, then go back to 15 mg once menses begins. 03/15/23  Yes Hurst, Boyer DASEN, PA-C  hydrochlorothiazide  (HYDRODIURIL ) 25 MG tablet Take 1 tablet (25 mg total) by mouth daily. 06/29/22  Yes Early, Sara E, NP  levalbuterol  (XOPENEX  HFA) 45 MCG/ACT inhaler Inhale 2 puffs into the lungs every 6 (six) hours as needed for wheezing. 04/01/23  Yes Ival Domino, FNP  promethazine -dextromethorphan (PROMETHAZINE -DM) 6.25-15 MG/5ML syrup Take 5 mLs by mouth 4 (four) times daily as needed for cough. May make drowsy.  Do not use and drive. 04/01/23  Yes Ival Domino, FNP  rosuvastatin  (CRESTOR ) 10 MG tablet Take 1 tablet (10 mg total) by mouth at bedtime. 06/29/22  Yes Early, Sara E, NP  Spacer/Aero-Holding Chambers (COMPACT SPACE CHAMBER) DEVI Use with the albuterol  inhaler 04/01/23  Yes Ival Domino, FNP  buPROPion  (WELLBUTRIN  XL) 150 MG 24 hr tablet Take 1 tablet (150 mg total) by mouth daily. 01/08/20 02/25/20  Rhys Boyer T, PA-C  sertraline  (ZOLOFT ) 25 MG tablet 3 po qd for 1 week, then 2 po qd for 1 week, then 1 po qd for 1 wk, then stop. 01/08/20 02/26/20  Rhys Boyer DASEN, PA-C    Family History Family History  Problem Relation Age of  Onset   Breast cancer Mother 58   Hypertension Mother    Thyroid  disease Mother        hypothyroid   Colon polyps Mother    Diabetes Mother    Colon cancer Maternal Grandmother    Thyroid  disease Maternal Grandmother    Stroke Maternal Grandmother    Kidney disease Maternal Grandmother    Leukemia Maternal Grandfather    Heart disease Maternal Grandfather     Social History Social History   Tobacco Use   Smoking status: Never   Smokeless tobacco: Never  Vaping Use   Vaping status: Never Used  Substance Use Topics   Alcohol use: No    Alcohol/week: 0.0 standard drinks of alcohol   Drug use: No     Allergies   Penicillins   Review of Systems Review of Systems  Constitutional:  Positive for fever. Negative for chills.  HENT:  Positive for congestion and sore throat. Negative for ear pain.   Eyes:  Negative for pain and visual disturbance.  Respiratory:  Positive for cough. Negative for shortness of breath.   Cardiovascular:  Negative for chest pain and palpitations.  Gastrointestinal:  Negative for abdominal pain, constipation, diarrhea, nausea and vomiting.  Genitourinary:  Negative for dysuria and hematuria.  Musculoskeletal:  Positive for arthralgias. Negative for back pain.  Skin:  Negative for color change and rash.  Neurological:  Positive for headaches. Negative for seizures and syncope.  All other systems reviewed and are negative.    Physical Exam Triage Vital Signs ED Triage Vitals  Encounter Vitals Group     BP 04/01/23 0927 119/82     Systolic BP Percentile --      Diastolic BP Percentile --      Pulse Rate 04/01/23 0927 100     Resp 04/01/23 0927 18     Temp 04/01/23 0927 99.8 F (37.7 C)     Temp Source 04/01/23 0927 Oral     SpO2 04/01/23 0927 98 %     Weight 04/01/23 0928 215 lb (97.5 kg)     Height 04/01/23 0928 5' 9 (1.753 m)     Head Circumference --      Peak Flow --      Pain Score 04/01/23 0928 8     Pain Loc --      Pain  Education --      Exclude from Growth Chart --    No data found.  Updated Vital Signs BP 119/82 (BP Location: Right Arm)   Pulse 100   Temp 99.8 F (37.7 C) (Oral)   Resp 18  Ht 5' 9 (1.753 m)   Wt 215 lb (97.5 kg)   LMP 03/17/2023   SpO2 98%   BMI 31.75 kg/m   Visual Acuity Right Eye Distance:   Left Eye Distance:   Bilateral Distance:    Right Eye Near:   Left Eye Near:    Bilateral Near:     Physical Exam Vitals and nursing note reviewed.  Constitutional:      General: She is not in acute distress.    Appearance: She is well-developed. She is ill-appearing. She is not toxic-appearing.  HENT:     Head: Normocephalic and atraumatic.     Right Ear: Hearing, tympanic membrane, ear canal and external ear normal.     Left Ear: Hearing, tympanic membrane, ear canal and external ear normal.     Nose: Congestion and rhinorrhea present. Rhinorrhea is clear.     Right Sinus: No maxillary sinus tenderness or frontal sinus tenderness.     Left Sinus: No maxillary sinus tenderness or frontal sinus tenderness.     Mouth/Throat:     Lips: Pink.     Mouth: Mucous membranes are moist.     Pharynx: Uvula midline. No oropharyngeal exudate or posterior oropharyngeal erythema.     Tonsils: No tonsillar exudate.  Eyes:     Conjunctiva/sclera: Conjunctivae normal.     Pupils: Pupils are equal, round, and reactive to light.  Cardiovascular:     Rate and Rhythm: Normal rate and regular rhythm.     Heart sounds: S1 normal and S2 normal. No murmur heard. Pulmonary:     Effort: Pulmonary effort is normal. No respiratory distress.     Breath sounds: Examination of the right-upper field reveals wheezing. Examination of the left-upper field reveals wheezing. Wheezing present. No decreased breath sounds, rhonchi or rales.  Abdominal:     Palpations: Abdomen is soft.     Tenderness: There is generalized abdominal tenderness (Mild).  Musculoskeletal:        General: No swelling.      Cervical back: Neck supple.  Lymphadenopathy:     Head:     Right side of head: No submental, submandibular, tonsillar, preauricular or posterior auricular adenopathy.     Left side of head: No submental, submandibular, tonsillar, preauricular or posterior auricular adenopathy.     Cervical: No cervical adenopathy.     Right cervical: No superficial cervical adenopathy.    Left cervical: No superficial cervical adenopathy.  Skin:    General: Skin is warm and dry.     Capillary Refill: Capillary refill takes less than 2 seconds.     Findings: No rash.  Neurological:     Mental Status: She is alert and oriented to person, place, and time.  Psychiatric:        Mood and Affect: Mood normal.      UC Treatments / Results  Labs (all labs ordered are listed, but only abnormal results are displayed) Labs Reviewed  POC COVID19/FLU A&B COMBO - Abnormal; Notable for the following components:      Result Value   Influenza A Antigen, POC Positive (*)    All other components within normal limits    EKG   Radiology No results found.  Procedures Procedures (including critical care time)  Medications Ordered in UC Medications  methylPREDNISolone  acetate (DEPO-MEDROL ) injection 40 mg (has no administration in time range)    Initial Impression / Assessment and Plan / UC Course  I have reviewed the triage vital signs and  the nursing notes.  Pertinent labs & imaging results that were available during my care of the patient were reviewed by me and considered in my medical decision making (see chart for details).     Positive for influenza type A.  Not a candidate for Tamiflu due to duration of symptoms.  Depo-Medrol , 40 mg, IM now.  Promethazine  DM, 5 mL, every 6 hours as needed for cough.  Albuterol  inhaler and spacer, 2 puffs, every 4-6 hours, as needed for wheezing.  Get plenty of fluids and rest.  Follow-up if symptoms do not improve, worsen or new symptoms occur.  Provided work  excuse if needed. Final Clinical Impressions(s) / UC Diagnoses   Final diagnoses:  Acute cough  Fever, unspecified  Viral bronchitis  Type A influenza     Discharge Instructions      Patient has influenza type A.  Due to duration of her symptoms, there will be no benefit from using Tamiflu.  Get plenty of fluids and rest.  Depo-Medrol , 40 mg, injected today for early bronchitis symptoms.  Provided Promethazine  DM, 5 mL, every 6 hours as needed for cough.  Albuterol  inhaler and spacer, 2 puffs, every 4-6 hours, as needed for wheezing.  Provided work excuse.  Follow-up if symptoms do not improve, worsen or new symptoms occur.     ED Prescriptions     Medication Sig Dispense Auth. Provider   levalbuterol  (XOPENEX  HFA) 45 MCG/ACT inhaler Inhale 2 puffs into the lungs every 6 (six) hours as needed for wheezing. 15 each Ival Domino, FNP   Spacer/Aero-Holding Chambers (COMPACT SPACE CHAMBER) DEVI Use with the albuterol  inhaler 1 each Ival Domino, FNP   promethazine -dextromethorphan (PROMETHAZINE -DM) 6.25-15 MG/5ML syrup Take 5 mLs by mouth 4 (four) times daily as needed for cough. May make drowsy.  Do not use and drive. 118 mL Ival Domino, FNP      PDMP not reviewed this encounter.   Ival Domino, FNP 04/01/23 [phone]

## 2023-04-01 NOTE — ED Triage Notes (Signed)
 Patient states that she started having body chills on Tuesday, developed a fever yesterday of 101.5.  Patient does have a headache, congestion and cough.  Patient has taken Dayquil, Nyquil, Ibuprofen , saline spray and Flonase.

## 2023-04-01 NOTE — Discharge Instructions (Signed)
 Patient has influenza type A.  Due to duration of her symptoms, there will be no benefit from using Tamiflu.  Get plenty of fluids and rest.  Depo-Medrol , 40 mg, injected today for early bronchitis symptoms.  Provided Promethazine  DM, 5 mL, every 6 hours as needed for cough.  Albuterol  inhaler and spacer, 2 puffs, every 4-6 hours, as needed for wheezing.  Provided work excuse.  Follow-up if symptoms do not improve, worsen or new symptoms occur.

## 2023-05-30 ENCOUNTER — Telehealth: Admitting: Physician Assistant

## 2023-05-30 ENCOUNTER — Other Ambulatory Visit (HOSPITAL_COMMUNITY): Payer: Self-pay

## 2023-05-30 ENCOUNTER — Other Ambulatory Visit: Payer: Self-pay

## 2023-05-30 DIAGNOSIS — L03811 Cellulitis of head [any part, except face]: Secondary | ICD-10-CM | POA: Diagnosis not present

## 2023-05-30 MED ORDER — DOXYCYCLINE HYCLATE 100 MG PO TABS
100.0000 mg | ORAL_TABLET | Freq: Two times a day (BID) | ORAL | 0 refills | Status: DC
  Filled 2023-05-30: qty 14, 7d supply, fill #0

## 2023-05-30 NOTE — Progress Notes (Signed)
 I have spent 5 minutes in review of e-visit questionnaire, review and updating patient chart, medical decision making and response to patient.   Piedad Climes, PA-C

## 2023-05-30 NOTE — Progress Notes (Signed)
E Visit for Cellulitis  We are sorry that you are not feeling well. Here is how we plan to help!  Based on what you shared with me it looks like you have cellulitis.  Cellulitis looks like areas of skin redness, swelling, and warmth; it develops as a result of bacteria entering under the skin. Little red spots and/or bleeding can be seen in skin, and tiny surface sacs containing fluid can occur. Fever can be present. Cellulitis is almost always on one side of a body, and the lower limbs are the most common site of involvement.   I have prescribed:  Doxycycline 100 mg twice daily for 7 days.   HOME CARE:  Take your medications as ordered and take all of them, even if the skin irritation appears to be healing.   GET HELP RIGHT AWAY IF:  Symptoms that don't begin to go away within 48 hours. Severe redness persists or worsens If the area turns color, spreads or swells. If it blisters and opens, develops yellow-brown crust or bleeds. You develop a fever or chills. If the pain increases or becomes unbearable.  Are unable to keep fluids and food down.  MAKE SURE YOU   Understand these instructions. Will watch your condition. Will get help right away if you are not doing well or get worse.  Thank you for choosing an e-visit.  Your e-visit answers were reviewed by a board certified advanced clinical practitioner to complete your personal care plan. Depending upon the condition, your plan could have included both over the counter or prescription medications.  Please review your pharmacy choice. Make sure the pharmacy is open so you can pick up prescription now. If there is a problem, you may contact your provider through [employer] and have the prescription routed to another pharmacy.  Your safety is important to [place]. If you have drug allergies check your prescription carefully.   For the next 24 hours you can use MyChart to ask questions about today's visit, request a non-urgent call  back, or ask for a work or school excuse. You will get an email in the next two days asking about your experience. I hope that your e-visit has been valuable and will speed your recovery.

## 2023-06-14 ENCOUNTER — Encounter: Payer: Self-pay | Admitting: Physician Assistant

## 2023-06-14 ENCOUNTER — Other Ambulatory Visit (HOSPITAL_COMMUNITY): Payer: Self-pay

## 2023-06-14 ENCOUNTER — Ambulatory Visit: Payer: 59 | Admitting: Physician Assistant

## 2023-06-14 DIAGNOSIS — F411 Generalized anxiety disorder: Secondary | ICD-10-CM

## 2023-06-14 DIAGNOSIS — G4700 Insomnia, unspecified: Secondary | ICD-10-CM | POA: Diagnosis not present

## 2023-06-14 DIAGNOSIS — F3281 Premenstrual dysphoric disorder: Secondary | ICD-10-CM

## 2023-06-14 DIAGNOSIS — F3342 Major depressive disorder, recurrent, in full remission: Secondary | ICD-10-CM

## 2023-06-14 MED ORDER — ESCITALOPRAM OXALATE 10 MG PO TABS
ORAL_TABLET | ORAL | 1 refills | Status: DC
  Filled 2023-06-14 – 2023-07-03 (×2): qty 105, 90d supply, fill #0
  Filled 2023-10-10: qty 105, 90d supply, fill #1

## 2023-06-14 NOTE — Progress Notes (Signed)
 [facility]  Patient ID: Judy Foster,  MRN: [id number]  PCP: Annella Kief, NP  Date of Evaluation: 06/14/2023 Time spent:20 minutes  Chief Complaint:  Chief Complaint   Anxiety; Depression; Follow-up    HISTORY/CURRENT STATUS: HPI for routine med check.  [name] is doing well for the most part.  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  She has been working so much and it took precedence over everything else, she did not have a lot of energy left to spend time with her family or things like that.  She is looking forward to cutting back her hours.  She kept taking the 10 mg of Lexapro , instead of increasing the dose as we had discussed.  She still has a hard time knowing when to increase up to 15 mg before her menses begins but when she does it helps with the PMS.  Patient is able to enjoy things.  Energy and motivation are good.  No extreme sadness, tearfulness, or feelings of hopelessness.  Sleeps well most of the time but she does need the Xanax  some nights. ADLs and personal hygiene are normal.   Denies any changes in concentration, making decisions, or remembering things.  Appetite has not changed.  Weight is stable.  She rarely needs the Xanax  during the day but on occasion it is helpful.  Denies suicidal or homicidal thoughts.  Patient denies increased energy with decreased need for sleep, increased talkativeness, racing thoughts, impulsivity or risky behaviors, increased spending, increased libido, grandiosity, increased irritability or anger, paranoia, or hallucinations.  Denies dizziness, syncope, seizures, numbness, tingling, tremor, tics, unsteady gait, slurred speech, confusion. Denies muscle or joint pain, stiffness, or dystonia.  Individual Medical History/ Review of Systems: Changes? :No     Past medications for mental health diagnoses include: Prozac caused drowiness, Wellbutrin , Zoloft  seemed to work for [employer], trazodone,  Melatonin, Viibryd  caused headaches, Lexapro , Xanax , Hydroxyzine  for   Allergies: Penicillins  Current Medications:  Current Outpatient Medications:    ALPRAZolam  (XANAX ) 0.5 MG tablet, Take 1 tablet by mouth 2 times daily as needed for anxiety., Disp: 180 tablet, Rfl: 0   hydrochlorothiazide  (HYDRODIURIL ) 25 MG tablet, Take 1 tablet (25 mg total) by mouth daily., Disp: 90 tablet, Rfl: 3   rosuvastatin  (CRESTOR ) 10 MG tablet, Take 1 tablet (10 mg total) by mouth at bedtime., Disp: 90 tablet, Rfl: 3   Spacer/Aero-Holding Chambers (COMPACT SPACE CHAMBER) DEVI, Use with the albuterol  inhaler, Disp: 1 each, Rfl: 0   cholecalciferol (VITAMIN D3) 25 MCG (1000 UNIT) tablet, Take 1,000 Units by mouth daily., Disp: , Rfl:    escitalopram  (LEXAPRO ) 10 MG tablet, Take 1 tablet by mouth daily then increase to 1.5 tablets daily for the 5 days prior to menses., Disp: 105 tablet, Rfl: 1   levalbuterol  (XOPENEX  HFA) 45 MCG/ACT inhaler, Inhale 2 puffs into the lungs every 6 (six) hours as needed for wheezing. (Patient not taking: Reported on 06/14/2023), Disp: 15 each, Rfl: 0   promethazine -dextromethorphan (PROMETHAZINE -DM) 6.25-15 MG/5ML syrup, Take 5 mLs by mouth 4 (four) times daily as needed for cough. May make drowsy.  Do not use and drive., Disp: 130 mL, Rfl: 0 Medication Side Effects: none  Family Medical/ Social History: Changes?  No  MENTAL HEALTH EXAM:  There were no vitals taken for this visit.There is no height or weight on file to calculate BMI.  General Appearance: Casual, Neat and Well Groomed  Eye Contact:  Good  Speech:  Clear and Coherent and Normal Rate  Volume:  Normal  Mood:  Euthymic  Affect:  Congruent  Thought Process:  Goal Directed and Descriptions of Associations: Circumstantial  Orientation:  Full (Time, Place, and Person)  Thought Content: Logical   Suicidal Thoughts:  No  Homicidal Thoughts:  No  Memory:  WNL  Judgement:  Good  Insight:  Good  Psychomotor Activity:   Normal  Concentration:  Concentration: Good and Attention Span: Good  Recall:  Good  Fund of Knowledge: Good  Language: Good  Assets:  Communication Skills Desire for Improvement Financial Resources/Insurance Housing Physical Health Resilience Social Support Transportation Vocational/Educational  ADL's:  Intact  Cognition: WNL  Prognosis:  Good   DIAGNOSES:    ICD-10-CM   1. Recurrent major depressive disorder, in full remission (HCC)  F33.42     2. Insomnia, unspecified type  G47.00     3. Generalized anxiety disorder  F41.1     4. PMDD (premenstrual dysphoric disorder)  F32.81      Receiving Psychotherapy: No   RECOMMENDATIONS:  PDMP was reviewed.  Last Xanax  filled 03/15/2023. I provided 20 minutes of face to face time during this encounter, including time spent before and after the visit in records review, medical decision making, counseling pertinent to today's visit, and charting.   She is doing well so no changes will be made.  Continue Xanax  0.5 mg, 1 p.o. twice daily as needed. Usually only at night.  Continue Lexapro  10 mg daily, but increase to 15 mg daily for the 5 days leading up to her menses. Return in 6 months.  Marvia Slocumb, PA-C

## 2023-06-24 ENCOUNTER — Other Ambulatory Visit (HOSPITAL_COMMUNITY): Payer: Self-pay

## 2023-07-03 ENCOUNTER — Other Ambulatory Visit: Payer: Self-pay | Admitting: Nurse Practitioner

## 2023-07-03 ENCOUNTER — Other Ambulatory Visit: Payer: Self-pay

## 2023-07-03 ENCOUNTER — Other Ambulatory Visit (HOSPITAL_COMMUNITY): Payer: Self-pay

## 2023-07-03 DIAGNOSIS — I10 Essential (primary) hypertension: Secondary | ICD-10-CM

## 2023-07-03 DIAGNOSIS — E7801 Familial hypercholesterolemia: Secondary | ICD-10-CM

## 2023-07-03 DIAGNOSIS — R7303 Prediabetes: Secondary | ICD-10-CM

## 2023-07-10 ENCOUNTER — Other Ambulatory Visit: Payer: Self-pay

## 2023-07-10 ENCOUNTER — Other Ambulatory Visit (HOSPITAL_COMMUNITY): Payer: Self-pay

## 2023-07-10 DIAGNOSIS — I10 Essential (primary) hypertension: Secondary | ICD-10-CM

## 2023-07-10 DIAGNOSIS — E7801 Familial hypercholesterolemia: Secondary | ICD-10-CM

## 2023-07-10 DIAGNOSIS — R7303 Prediabetes: Secondary | ICD-10-CM

## 2023-07-10 MED ORDER — ROSUVASTATIN CALCIUM 10 MG PO TABS
10.0000 mg | ORAL_TABLET | Freq: Every day | ORAL | 0 refills | Status: DC
Start: 2023-07-10 — End: 2023-08-24
  Filled 2023-07-10 – 2023-07-20 (×2): qty 90, 90d supply, fill #0

## 2023-07-10 MED ORDER — HYDROCHLOROTHIAZIDE 25 MG PO TABS
25.0000 mg | ORAL_TABLET | Freq: Every day | ORAL | 0 refills | Status: DC
  Filled 2023-07-10: qty 90, 90d supply, fill #0

## 2023-07-19 ENCOUNTER — Other Ambulatory Visit (HOSPITAL_COMMUNITY): Payer: Self-pay

## 2023-07-20 ENCOUNTER — Other Ambulatory Visit (HOSPITAL_COMMUNITY): Payer: Self-pay

## 2023-07-27 ENCOUNTER — Other Ambulatory Visit: Payer: Self-pay | Admitting: Nurse Practitioner

## 2023-07-27 DIAGNOSIS — Z1231 Encounter for screening mammogram for malignant neoplasm of breast: Secondary | ICD-10-CM

## 2023-08-03 ENCOUNTER — Ambulatory Visit
Admission: RE | Admit: 2023-08-03 | Discharge: 2023-08-03 | Disposition: A | Discharge status: Home / Self Care | Source: Ambulatory Visit | Attending: Nurse Practitioner | Admitting: Nurse Practitioner

## 2023-08-03 DIAGNOSIS — Z1231 Encounter for screening mammogram for malignant neoplasm of breast: Secondary | ICD-10-CM

## 2023-08-10 ENCOUNTER — Ambulatory Visit: Payer: Self-pay | Admitting: Nurse Practitioner

## 2023-08-24 ENCOUNTER — Ambulatory Visit: Admitting: Nurse Practitioner

## 2023-08-24 ENCOUNTER — Other Ambulatory Visit (HOSPITAL_COMMUNITY): Payer: Self-pay

## 2023-08-24 VITALS — BP 130/78 | HR 70 | Ht 69.0 in | Wt 213.6 lb

## 2023-08-24 DIAGNOSIS — R7303 Prediabetes: Secondary | ICD-10-CM | POA: Diagnosis not present

## 2023-08-24 DIAGNOSIS — E7801 Familial hypercholesterolemia: Secondary | ICD-10-CM

## 2023-08-24 DIAGNOSIS — Z6831 Body mass index (BMI) 31.0-31.9, adult: Secondary | ICD-10-CM

## 2023-08-24 DIAGNOSIS — I10 Essential (primary) hypertension: Secondary | ICD-10-CM

## 2023-08-24 MED ORDER — ROSUVASTATIN CALCIUM 10 MG PO TABS
10.0000 mg | ORAL_TABLET | Freq: Every day | ORAL | 3 refills | Status: AC
  Filled 2023-08-24 – 2023-10-10 (×3): qty 90, 90d supply, fill #0
  Filled 2023-12-17 – 2023-12-28 (×2): qty 90, 90d supply, fill #1
  Filled 2024-03-22: qty 90, 90d supply, fill #2

## 2023-08-24 MED ORDER — HYDROCHLOROTHIAZIDE 25 MG PO TABS
25.0000 mg | ORAL_TABLET | Freq: Every day | ORAL | 3 refills | Status: AC
  Filled 2023-08-24 – 2023-09-15 (×2): qty 90, 90d supply, fill #0
  Filled 2023-12-17: qty 90, 90d supply, fill #1
  Filled 2024-03-22: qty 90, 90d supply, fill #2

## 2023-08-24 NOTE — Patient Instructions (Signed)
 VISIT SUMMARY:  Today, we discussed your hypertension and hyperlipidemia during your routine follow-up visit. Your blood pressure was slightly elevated at 130 mmHg, and we talked about the importance of monitoring it at home. We also addressed your cholesterol levels and the potential need to adjust your medication. Additionally, we discussed general health maintenance, including the importance of exercise and a healthy diet, despite your busy work schedule.  YOUR PLAN:  -HYPERTENSION: Hypertension, or high blood pressure, means that the force of the blood against your artery walls is too high. Your blood pressure today was 130 mmHg, which is slightly elevated. Please monitor your blood pressure at home for one week and report any persistent elevation. If your readings are consistently high, we may need to adjust your medication.  -HYPERLIPIDEMIA: Hyperlipidemia means having high levels of lipids (fats) in your blood, which can increase your risk of heart disease. We are concerned about your LDL cholesterol levels and will check them with a [facility] requisition. If your LDL is not below 100 mg/dL, we may need to increase your Crestor  dosage.  -GENERAL HEALTH MAINTENANCE: Maintaining a healthy lifestyle is important for overall well-being. We discussed the importance of regular exercise and a healthy diet, even with your demanding work schedule. Try to find ways to incorporate physical activity and healthy eating into your routine to help manage your blood pressure and cholesterol levels.  INSTRUCTIONS:  Please monitor your blood pressure at home for one week and report any persistent elevation. Additionally, get your cholesterol checked with the [facility] requisition provided. Follow up with us  if your home blood pressure readings are consistently high or if you have any concerns about your cholesterol levels.

## 2023-08-31 ENCOUNTER — Ambulatory Visit: Admitting: Nurse Practitioner

## 2023-09-04 ENCOUNTER — Other Ambulatory Visit (HOSPITAL_COMMUNITY): Payer: Self-pay

## 2023-09-13 NOTE — Progress Notes (Signed)
 Catheline Doing, DNP, AGNP-c [facility]  [address] [place], [place] [address] [phone]  ESTABLISHED PATIENT- Chronic Health and/or Follow-Up Visit  Blood pressure 130/78, pulse 70, height 5' 9 (1.753 m), weight 213 lb 9.6 oz (96.9 kg), last menstrual period 08/12/2023, SpO2 98%.   History of Present Illness Judy Foster is a 48 year old female with hypertension who presents for a routine follow-up.  Her blood pressure was measured at 130 mmHg during today's visit. She does not check her blood pressure at home and does not recall any recent measurements except for a reading of 119 mmHg at an unspecified location. No symptoms such as headaches or vision changes. She is not experiencing any issues with her current medication, hydrochlorothiazide  (HCTZ).  She acknowledges that her weight has remained stable. She does not engage in healthy activities due to her demanding work schedule, which leaves her feeling exhausted and lacking motivation for physical activity.  She is currently taking HCTZ for her hypertension.  All ROS negative with exception of what is listed above.   PHYSICAL EXAM Physical Exam Vitals and nursing note reviewed.  Constitutional:      Appearance: Normal appearance.  HENT:     Head: Normocephalic.  Eyes:     Pupils: Pupils are equal, round, and reactive to light.  Neck:     Vascular: No carotid bruit.  Cardiovascular:     Rate and Rhythm: Normal rate and regular rhythm.     Pulses: Normal pulses.     Heart sounds: Normal heart sounds.  Pulmonary:     Effort: Pulmonary effort is normal.     Breath sounds: Normal breath sounds.  Musculoskeletal:        General: Normal range of motion.     Cervical back: Normal range of motion.  Skin:    General: Skin is warm.  Neurological:     General: No focal deficit present.     Mental Status: She is alert and oriented to person, place, and time.  Psychiatric:        Mood and Affect:  Mood normal.      PLAN Problem List Items Addressed This Visit     Prediabetes   Relevant Medications   rosuvastatin  (CRESTOR ) 10 MG tablet   Other Relevant Orders   Hemoglobin A1c   CBC with Differential/Platelet   Comprehensive metabolic panel with GFR   Lipid panel   Familial hypercholesterolemia   Relevant Medications   rosuvastatin  (CRESTOR ) 10 MG tablet   hydrochlorothiazide  (HYDRODIURIL ) 25 MG tablet   Other Relevant Orders   Hemoglobin A1c   CBC with Differential/Platelet   Comprehensive metabolic panel with GFR   Lipid panel   Primary hypertension   Relevant Medications   rosuvastatin  (CRESTOR ) 10 MG tablet   hydrochlorothiazide  (HYDRODIURIL ) 25 MG tablet   Other Relevant Orders   Hemoglobin A1c   CBC with Differential/Platelet   Comprehensive metabolic panel with GFR   Lipid panel   Other Visit Diagnoses       BMI 31.0-31.9,adult    -  Primary   Relevant Orders   Hemoglobin A1c   CBC with Differential/Platelet   Comprehensive metabolic panel with GFR   Lipid panel      Assessment and Plan Hypertension Blood pressure is slightly elevated at 130 mmHg without symptoms such as headaches or vision changes. She acknowledges that weight loss could aid in management. No issues with current HCTZ medication. Emphasized the importance of home blood pressure monitoring to  assess if elevation is situational or persistent. - Monitor blood pressure at home for one week and report persistent elevation. - Consider medication adjustment if home readings are consistently high.  Hyperlipidemia Concerned about LDL levels and interested in increasing Crestor  dosage if LDL is not below 100 mg/dL. No muscle cramps reported. Emphasized maintaining LDL below 100 mg/dL to reduce cardiovascular risk. - Order [facility] requisition for cholesterol check. - Consider increasing Crestor  dosage if LDL is not below 100 mg/dL.  Pre-DM Recheck labs today. Diet and exercise managed.    General Health Maintenance Acknowledges need for lifestyle modifications but finds it challenging due to work schedule and fatigue. Discussed importance of exercise and healthy eating habits. Recognizes impact of work-related stress and fatigue on maintaining a healthy lifestyle. - Encourage lifestyle modifications including regular exercise and healthy diet. Return in about 6 months (around 02/24/2024) for CPE.  Catheline Doing, DNP, AGNP-c

## 2023-09-15 ENCOUNTER — Other Ambulatory Visit (HOSPITAL_COMMUNITY): Payer: Self-pay

## 2023-09-15 ENCOUNTER — Other Ambulatory Visit: Payer: Self-pay

## 2023-09-26 DIAGNOSIS — E7801 Familial hypercholesterolemia: Secondary | ICD-10-CM | POA: Diagnosis not present

## 2023-09-26 DIAGNOSIS — Z6831 Body mass index (BMI) 31.0-31.9, adult: Secondary | ICD-10-CM | POA: Diagnosis not present

## 2023-09-26 DIAGNOSIS — R7303 Prediabetes: Secondary | ICD-10-CM | POA: Diagnosis not present

## 2023-09-26 DIAGNOSIS — I10 Essential (primary) hypertension: Secondary | ICD-10-CM | POA: Diagnosis not present

## 2023-09-26 LAB — LIPID PANEL

## 2023-09-27 ENCOUNTER — Ambulatory Visit: Payer: Self-pay | Admitting: Nurse Practitioner

## 2023-09-27 DIAGNOSIS — E876 Hypokalemia: Secondary | ICD-10-CM

## 2023-09-27 LAB — CBC WITH DIFFERENTIAL/PLATELET
Basophils Absolute: 0 x10E3/uL (ref 0.0–0.2)
Basos: 1 %
EOS (ABSOLUTE): 0.1 x10E3/uL (ref 0.0–0.4)
Eos: 2 %
Hematocrit: 37.4 % (ref 34.0–46.6)
Hemoglobin: 12.4 g/dL (ref 11.1–15.9)
Immature Grans (Abs): 0 x10E3/uL (ref 0.0–0.1)
Immature Granulocytes: 0 %
Lymphocytes Absolute: 2.7 x10E3/uL (ref 0.7–3.1)
Lymphs: 40 %
MCH: 32 pg (ref 26.6–33.0)
MCHC: 33.2 g/dL (ref 31.5–35.7)
MCV: 96 fL (ref 79–97)
Monocytes Absolute: 0.4 x10E3/uL (ref 0.1–0.9)
Monocytes: 6 %
Neutrophils Absolute: 3.6 x10E3/uL (ref 1.4–7.0)
Neutrophils: 51 %
Platelets: 307 x10E3/uL (ref 150–450)
RBC: 3.88 x10E6/uL (ref 3.77–5.28)
RDW: 12.2 % (ref 11.7–15.4)
WBC: 6.9 x10E3/uL (ref 3.4–10.8)

## 2023-09-27 LAB — COMPREHENSIVE METABOLIC PANEL WITH GFR
ALT: 36 IU/L — AB (ref 0–32)
AST: 29 IU/L (ref 0–40)
Albumin: 4.7 g/dL (ref 3.9–4.9)
Alkaline Phosphatase: 63 IU/L (ref 44–121)
BUN/Creatinine Ratio: 12 (ref 9–23)
BUN: 10 mg/dL (ref 6–24)
Bilirubin Total: 0.4 mg/dL (ref 0.0–1.2)
CO2: 24 mmol/L (ref 20–29)
Calcium: 9.6 mg/dL (ref 8.7–10.2)
Chloride: 96 mmol/L (ref 96–106)
Creatinine, Ser: 0.81 mg/dL (ref 0.57–1.00)
Globulin, Total: 2.6 g/dL (ref 1.5–4.5)
Glucose: 101 mg/dL — AB (ref 70–99)
Potassium: 3.2 mmol/L — AB (ref 3.5–5.2)
Sodium: 139 mmol/L (ref 134–144)
Total Protein: 7.3 g/dL (ref 6.0–8.5)
eGFR: 91 mL/min/1.73 (ref >59)

## 2023-09-27 LAB — LIPID PANEL
Cholesterol, Total: 176 mg/dL (ref 100–199)
HDL: 40 mg/dL (ref >39)
LDL CALC COMMENT:: 4.4 ratio (ref 0.0–4.4)
LDL Chol Calc (NIH): 95 mg/dL (ref 0–99)
Triglycerides: 240 mg/dL — AB (ref 0–149)
VLDL Cholesterol Cal: 41 mg/dL — AB (ref 5–40)

## 2023-09-27 LAB — HEMOGLOBIN A1C
Est. average glucose Bld gHb Est-mCnc: 117 mg/dL
Hgb A1c MFr Bld: 5.7 % — ABNORMAL HIGH (ref 4.8–5.6)

## 2023-10-10 ENCOUNTER — Other Ambulatory Visit (HOSPITAL_COMMUNITY): Payer: Self-pay

## 2023-11-08 ENCOUNTER — Encounter: Payer: Self-pay | Admitting: Pediatrics

## 2023-11-30 ENCOUNTER — Ambulatory Visit (AMBULATORY_SURGERY_CENTER)

## 2023-11-30 ENCOUNTER — Other Ambulatory Visit (HOSPITAL_COMMUNITY): Payer: Self-pay

## 2023-11-30 VITALS — Ht 69.0 in | Wt 215.0 lb

## 2023-11-30 DIAGNOSIS — Z1211 Encounter for screening for malignant neoplasm of colon: Secondary | ICD-10-CM

## 2023-11-30 MED ORDER — NA SULFATE-K SULFATE-MG SULF 17.5-3.13-1.6 GM/177ML PO SOLN
1.0000 | Freq: Once | ORAL | 0 refills | Status: AC
  Filled 2023-11-30 – 2023-12-13 (×2): qty 354, 1d supply, fill #0

## 2023-11-30 NOTE — Progress Notes (Signed)
 No issues known to pt with past sedation with any surgeries or procedures Patient denies ever being told they had issues or difficulty with intubation  No FH of Malignant Hyperthermia Pt is not on diet pills nor GLP-1 medications Pt is not on home 02  Pt is not on blood thinners  Pt states occasional issues with chronic constipation  No A fib or A flutter Have any cardiac testing pending--no Pt instructed to use [email or web address] or GoodRx for a price reduction on prep  Ambulates independently

## 2023-12-07 ENCOUNTER — Encounter: Payer: Self-pay | Admitting: Pediatrics

## 2023-12-10 ENCOUNTER — Other Ambulatory Visit (HOSPITAL_COMMUNITY): Payer: Self-pay

## 2023-12-13 ENCOUNTER — Other Ambulatory Visit (HOSPITAL_COMMUNITY): Payer: Self-pay

## 2023-12-13 NOTE — Progress Notes (Unsigned)
 [facility] History and Physical   Primary Care Physician:  Early, Camie BRAVO, NP   Reason for Procedure:  Colorectal cancer screening  Plan:    Colonoscopy     HPI: Judy Foster is a 48 y.o. female undergoing screening colonoscopy for colorectal cancer screening.  Patient previously underwent colonoscopy in 2015 for change in bowel habits and this was normal.  There is a reported family history of colorectal cancer in the patient's maternal grandmother.  No family history of polyps.  Patient denies current symptoms of change in bowel habits or rectal bleeding.   Past Medical History:  Diagnosis Date   Anal fissure    Anxiety    Asthma, exercise induced    as a teen   Cardiac arrhythmia    Depression    Dysmenorrhea    past   HLD (hyperlipidemia)    Hyperlipidemia    Hypertension    Mononucleosis 1993   Palpitations 10/15/2009   Qualifier: Diagnosis of  By: Wynetta CMA, Niels Ades     Past Surgical History:  Procedure Laterality Date   COLONOSCOPY     CRANIECTOMY SUBOCCIPITAL W/ CERVICAL LAMINECTOMY / CHIARI  2006   DILATATION & CURETTAGE/HYSTEROSCOPY WITH MYOSURE N/A 10/13/2020   Procedure: DILATATION & CURETTAGE/HYSTEROSCOPY WITH MYOSURE;  Surgeon: Cleotilde Ronal RAMAN, MD;  Location: [facility] OR;  Service: Gynecology;  Laterality: N/A;    Prior to Admission medications   Medication Sig Start Date End Date Taking? Authorizing Provider  ALPRAZolam  (XANAX ) 0.5 MG tablet Take 1 tablet by mouth 2 times daily as needed for anxiety. 03/15/23   Rhys Boyer T, PA-C  escitalopram  (LEXAPRO ) 10 MG tablet Take 1 tablet by mouth daily then increase to 1.5 tablets daily for the 5 days prior to menses. 06/14/23   Rhys Boyer T, PA-C  hydrochlorothiazide  (HYDRODIURIL ) 25 MG tablet Take 1 tablet (25 mg total) by mouth daily. 08/24/23   Early, Sara E, NP  levalbuterol  (XOPENEX  HFA) 45 MCG/ACT inhaler Inhale 2 puffs into the lungs every 6 (six) hours as needed for wheezing.  04/01/23   Ival Domino, FNP  Na Sulfate-K Sulfate-Mg Sulfate concentrate (SUPREP) 17.5-3.13-1.6 GM/177ML SOLN Take 1 kit (354 mLs total) by mouth once for 1 dose. 11/30/23 12/14/23  Suzann Inocente HERO, MD  rosuvastatin  (CRESTOR ) 10 MG tablet Take 1 tablet (10 mg total) by mouth at bedtime. 08/24/23   Early, Sara E, NP  Spacer/Aero-Holding Chambers (COMPACT SPACE CHAMBER) DEVI Use with the albuterol  inhaler 04/01/23   Ival Domino, FNP  buPROPion  (WELLBUTRIN  XL) 150 MG 24 hr tablet Take 1 tablet (150 mg total) by mouth daily. 01/08/20 02/25/20  Rhys Boyer T, PA-C  sertraline  (ZOLOFT ) 25 MG tablet 3 po qd for 1 week, then 2 po qd for 1 week, then 1 po qd for 1 wk, then stop. 01/08/20 02/26/20  Rhys Boyer DASEN, PA-C    Current Outpatient Medications  Medication Sig Dispense Refill   ALPRAZolam  (XANAX ) 0.5 MG tablet Take 1 tablet by mouth 2 times daily as needed for anxiety. 180 tablet 0   escitalopram  (LEXAPRO ) 10 MG tablet Take 1 tablet by mouth daily then increase to 1.5 tablets daily for the 5 days prior to menses. 105 tablet 1   hydrochlorothiazide  (HYDRODIURIL ) 25 MG tablet Take 1 tablet (25 mg total) by mouth daily. 90 tablet 3   levalbuterol  (XOPENEX  HFA) 45 MCG/ACT inhaler Inhale 2 puffs into the lungs every 6 (six) hours as needed for wheezing. 15 each  0   Na Sulfate-K Sulfate-Mg Sulfate concentrate (SUPREP) 17.5-3.13-1.6 GM/177ML SOLN Take 1 kit (354 mLs total) by mouth once for 1 dose. 354 mL 0   rosuvastatin  (CRESTOR ) 10 MG tablet Take 1 tablet (10 mg total) by mouth at bedtime. 90 tablet 3   Spacer/Aero-Holding Chambers (COMPACT SPACE CHAMBER) DEVI Use with the albuterol  inhaler 1 each 0   No current facility-administered medications for this visit.    Allergies as of 12/14/2023 - Review Complete 11/30/2023  Allergen Reaction Noted   Penicillins Rash     Family History  Problem Relation Age of Onset   Breast cancer Mother 22   Hypertension Mother    Thyroid  disease Mother         hypothyroid   Colon polyps Mother    Diabetes Mother    Colon cancer Maternal Grandmother    Thyroid  disease Maternal Grandmother    Stroke Maternal Grandmother    Kidney disease Maternal Grandmother    Leukemia Maternal Grandfather    Heart disease Maternal Grandfather    Esophageal cancer Neg Hx    Rectal cancer Neg Hx    Stomach cancer Neg Hx     Social History   Socioeconomic History   Marital status: Married    Spouse name: Not on file   Number of children: 2   Years of education: Not on file   Highest education level: Doctorate  Occupational History   Occupation: Academic librarian: [employer]  Tobacco Use   Smoking status: Never   Smokeless tobacco: Never  Vaping Use   Vaping status: Never Used  Substance and Sexual Activity   Alcohol use: No    Alcohol/week: 0.0 standard drinks of alcohol   Drug use: No   Sexual activity: Yes    Partners: Male    Comment: Husband has Vasectomy  Other Topics Concern   Not on file  Social History Narrative   Not on file   Social Drivers of Health   Financial Resource Strain: Low Risk  (08/24/2023)   Overall Financial Resource Strain (CARDIA)    Difficulty of Paying Living Expenses: Not hard at all  Food Insecurity: No Food Insecurity (08/24/2023)   Hunger Vital Sign    Worried About Running Out of Food in the Last Year: Never true    Ran Out of Food in the Last Year: Never true  Transportation Needs: No Transportation Needs (08/24/2023)   PRAPARE - Administrator, Civil Service (Medical): No    Lack of Transportation (Non-Medical): No  Physical Activity: Inactive (08/24/2023)   Exercise Vital Sign    Days of Exercise per Week: 0 days    Minutes of Exercise per Session: Not on file  Stress: Stress Concern Present (08/24/2023)   [employer] of Occupational Health - Occupational Stress Questionnaire    Feeling of Stress: To some extent  Social Connections: Socially Integrated (08/24/2023)   Social Connection and  Isolation Panel    Frequency of Communication with Friends and Family: More than three times a week    Frequency of Social Gatherings with Friends and Family: Twice a week    Attends Religious Services: More than 4 times per year    Active Member of [employer] or Organizations: Yes    Attends Engineer, structural: More than 4 times per year    Marital Status: Married  Catering manager Violence: Not on file    Review of Systems:  All other review of systems  negative except as mentioned in the HPI.  Physical Exam: Vital signs There were no vitals taken for this visit.  General:   Alert,  Well-developed, well-nourished, pleasant and cooperative in NAD Airway:  Mallampati  Lungs:  Clear throughout to auscultation.   Heart:  Regular rate and rhythm; no murmurs, clicks, rubs,  or gallops. Abdomen:  Soft, nontender and nondistended. Normal bowel sounds.   Neuro/Psych:  Normal mood and affect. A and O x 3  Inocente Hausen, MD [facility]

## 2023-12-14 ENCOUNTER — Ambulatory Visit: Admitting: Pediatrics

## 2023-12-14 ENCOUNTER — Encounter: Payer: Self-pay | Admitting: Pediatrics

## 2023-12-14 ENCOUNTER — Ambulatory Visit: Admitting: Physician Assistant

## 2023-12-14 VITALS — BP 112/72 | HR 56 | Temp 98.4°F | Resp 14 | Ht 69.0 in | Wt 215.0 lb

## 2023-12-14 DIAGNOSIS — D123 Benign neoplasm of transverse colon: Secondary | ICD-10-CM | POA: Diagnosis not present

## 2023-12-14 DIAGNOSIS — I10 Essential (primary) hypertension: Secondary | ICD-10-CM | POA: Diagnosis not present

## 2023-12-14 DIAGNOSIS — K635 Polyp of colon: Secondary | ICD-10-CM | POA: Diagnosis not present

## 2023-12-14 DIAGNOSIS — F32A Depression, unspecified: Secondary | ICD-10-CM | POA: Diagnosis not present

## 2023-12-14 DIAGNOSIS — D125 Benign neoplasm of sigmoid colon: Secondary | ICD-10-CM

## 2023-12-14 DIAGNOSIS — Z83719 Family history of colon polyps, unspecified: Secondary | ICD-10-CM | POA: Diagnosis not present

## 2023-12-14 DIAGNOSIS — E785 Hyperlipidemia, unspecified: Secondary | ICD-10-CM | POA: Diagnosis not present

## 2023-12-14 DIAGNOSIS — K648 Other hemorrhoids: Secondary | ICD-10-CM | POA: Diagnosis not present

## 2023-12-14 DIAGNOSIS — D120 Benign neoplasm of cecum: Secondary | ICD-10-CM

## 2023-12-14 DIAGNOSIS — Z1211 Encounter for screening for malignant neoplasm of colon: Secondary | ICD-10-CM

## 2023-12-14 DIAGNOSIS — K5730 Diverticulosis of large intestine without perforation or abscess without bleeding: Secondary | ICD-10-CM | POA: Diagnosis not present

## 2023-12-14 DIAGNOSIS — F419 Anxiety disorder, unspecified: Secondary | ICD-10-CM | POA: Diagnosis not present

## 2023-12-14 MED ORDER — SODIUM CHLORIDE 0.9 % IV SOLN: 500.0000 mL | INTRAVENOUS | Status: DC

## 2023-12-14 NOTE — Progress Notes (Signed)
 Called to room to assist during endoscopic procedure.  Patient ID and intended procedure confirmed with present staff. Received instructions for my participation in the procedure from the performing physician.

## 2023-12-14 NOTE — Patient Instructions (Signed)

## 2023-12-14 NOTE — Op Note (Signed)
 [facility] Patient Name: Judy Foster Procedure Date: 12/14/2023 7:08 AM MRN: [id number] Endoscopist: Inocente Hausen , MD, [id number] Age: 48 Referring MD:  Date of Birth: 06-Oct-1975 Gender: Female Account #: [id number] Procedure:                Colonoscopy Indications:              Colon cancer screening in patient at increased                            risk: Family history of 1st-degree relative with                            colon polyps, Last colonoscopy: 2015, Family                            history of colon cancer in a distant relative -                            maternal grandmother with colon cancer Medicines:                Monitored Anesthesia Care Procedure:                Pre-Anesthesia Assessment:                           - Prior to the procedure, a History and Physical                            was performed, and patient medications and                            allergies were reviewed. The patient's tolerance of                            previous anesthesia was also reviewed. The risks                            and benefits of the procedure and the sedation                            options and risks were discussed with the patient.                            All questions were answered, and informed consent                            was obtained. Prior Anticoagulants: The patient has                            taken no anticoagulant or antiplatelet agents. ASA                            Grade Assessment: II - A patient with mild systemic  disease. After reviewing the risks and benefits,                            the patient was deemed in satisfactory condition to                            undergo the procedure.                           After obtaining informed consent, the colonoscope                            was passed under direct vision. Throughout the                            procedure, the patient's blood  pressure, pulse, and                            oxygen saturations were monitored continuously. The                            Olympus Scope SN: [id number] was introduced through                            the anus and advanced to the cecum, identified by                            appendiceal orifice and ileocecal valve. The                            colonoscopy was performed without difficulty. The                            patient tolerated the procedure well. The quality                            of the bowel preparation was good. The ileocecal                            valve, appendiceal orifice, and rectum were                            photographed. Scope In: 8:01:18 AM Scope Out: 8:22:05 AM Scope Withdrawal Time: 0 hours 12 minutes 48 seconds  Total Procedure Duration: 0 hours 20 minutes 47 seconds  Findings:                 The perianal and digital rectal examinations were                            normal. Pertinent negatives include normal                            sphincter tone and no palpable rectal lesions.  A few small-mouthed diverticula were found in the                            sigmoid colon.                           An 8 mm polyp was found in the cecum. The polyp was                            sessile. Polypectomy was attempted, initially using                            a cold snare. Polyp resection was incomplete with                            this device. This intervention then required a                            different device and polypectomy technique. The                            polyp was removed with a hot snare. Resection and                            retrieval were complete.                           A 7 mm polyp was found in the transverse colon. The                            polyp was sessile. The polyp was removed with a                            cold snare. Resection and retrieval were complete.                            A 4 mm polyp was found in the sigmoid colon. The                            polyp was sessile. The polyp was removed with a                            cold biopsy forceps. Resection and retrieval were                            complete.                           Internal hemorrhoids were found during retroflexion. Complications:            No immediate complications. Estimated blood loss:                            Minimal. Estimated Blood Loss:  Estimated blood loss was minimal. Impression:               - Diverticulosis in the sigmoid colon.                           - One 8 mm polyp in the cecum, removed with a hot                            snare. Resected and retrieved.                           - One 7 mm polyp in the transverse colon, removed                            with a cold snare. Resected and retrieved.                           - One 4 mm polyp in the sigmoid colon, removed with                            a cold biopsy forceps. Resected and retrieved.                           - Internal hemorrhoids. Recommendation:           - Discharge patient to home (ambulatory).                           - Await pathology results.                           - Repeat colonoscopy for surveillance based on                            pathology results.                           - The findings and recommendations were discussed                            with the patient's family.                           - Patient has a contact number available for                            emergencies. The signs and symptoms of potential                            delayed complications were discussed with the                            patient. Return to normal activities tomorrow.                            Written  discharge instructions were provided to the                            patient. Inocente Hausen, MD 12/14/2023 8:27:22 AM This report has been signed electronically.

## 2023-12-14 NOTE — Progress Notes (Signed)
 Pt's states no medical or surgical changes since previsit or office visit.

## 2023-12-14 NOTE — Progress Notes (Signed)
 Sedate, gd SR, tolerated procedure well, VSS, report to RN

## 2023-12-15 ENCOUNTER — Telehealth: Payer: Self-pay

## 2023-12-15 NOTE — Telephone Encounter (Signed)
Attempted to reach patient for post-procedure f/u call. No answer. Left message for her to please not hesitate to call if she has any questions/concerns regarding her care. 

## 2023-12-17 ENCOUNTER — Other Ambulatory Visit: Payer: Self-pay | Admitting: Physician Assistant

## 2023-12-17 MED ORDER — ESCITALOPRAM OXALATE 10 MG PO TABS
ORAL_TABLET | ORAL | 0 refills | Status: DC
  Filled 2023-12-17 – 2023-12-21 (×3): qty 105, 90d supply, fill #0

## 2023-12-18 ENCOUNTER — Other Ambulatory Visit (HOSPITAL_COMMUNITY): Payer: Self-pay

## 2023-12-18 ENCOUNTER — Ambulatory Visit: Payer: Self-pay | Admitting: Pediatrics

## 2023-12-18 LAB — SURGICAL PATHOLOGY

## 2023-12-18 MED ORDER — FLUZONE 0.5 ML IM SUSY
0.5000 mL | PREFILLED_SYRINGE | Freq: Once | INTRAMUSCULAR | 0 refills | Status: AC
  Filled 2023-12-18: qty 0.5, 1d supply, fill #0

## 2023-12-19 ENCOUNTER — Other Ambulatory Visit (HOSPITAL_COMMUNITY): Payer: Self-pay

## 2023-12-19 ENCOUNTER — Other Ambulatory Visit: Payer: Self-pay

## 2023-12-19 NOTE — Telephone Encounter (Signed)
 5 year recall placed in Epic.

## 2023-12-20 ENCOUNTER — Other Ambulatory Visit (HOSPITAL_COMMUNITY): Payer: Self-pay

## 2023-12-21 ENCOUNTER — Other Ambulatory Visit (HOSPITAL_COMMUNITY): Payer: Self-pay

## 2023-12-21 ENCOUNTER — Other Ambulatory Visit: Payer: Self-pay

## 2024-01-02 ENCOUNTER — Other Ambulatory Visit (HOSPITAL_COMMUNITY): Payer: Self-pay

## 2024-01-02 ENCOUNTER — Encounter: Payer: Self-pay | Admitting: Physician Assistant

## 2024-01-02 ENCOUNTER — Ambulatory Visit: Admitting: Physician Assistant

## 2024-01-02 DIAGNOSIS — F3281 Premenstrual dysphoric disorder: Secondary | ICD-10-CM | POA: Diagnosis not present

## 2024-01-02 DIAGNOSIS — F4323 Adjustment disorder with mixed anxiety and depressed mood: Secondary | ICD-10-CM

## 2024-01-02 DIAGNOSIS — G4700 Insomnia, unspecified: Secondary | ICD-10-CM

## 2024-01-02 MED ORDER — ESCITALOPRAM OXALATE 10 MG PO TABS: ORAL_TABLET | ORAL | Status: AC

## 2024-01-02 MED ORDER — ALPRAZOLAM 0.5 MG PO TABS
0.5000 mg | ORAL_TABLET | Freq: Two times a day (BID) | ORAL | 0 refills | Status: AC | PRN
  Filled 2024-01-02 – 2024-02-23 (×2): qty 180, 90d supply, fill #0

## 2024-01-02 NOTE — Progress Notes (Signed)
 [facility]  Patient ID: Judy Foster,  MRN: [id number]  PCP: Oris Camie BRAVO, NP  Date of Evaluation: 01/02/2024 Time spent:20 minutes  Chief Complaint:  Chief Complaint   Anxiety; Depression; Follow-up     HISTORY/CURRENT STATUS: HPI for routine med check.  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  Overwhelmed.  Xanax  helps when needed.  Anhedonia.   Work is unfulfilling.  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 Is working a notice, 3 more weeks. No feelings of hopelessness.  Sleeps well most of the time. ADLs and personal hygiene are normal.   Appetite has not changed.  Weight is stable.  No SI/HI.  Patient denies increased energy with decreased need for sleep, increased talkativeness, racing thoughts, impulsivity or risky behaviors, increased spending, increased libido, grandiosity, increased irritability or anger, paranoia, or hallucinations.  Individual Medical History/ Review of Systems: Changes? :No     Past medications for mental health diagnoses include: Prozac caused drowiness and sexual SE, Wellbutrin , Zoloft  seemed to work for [employer], trazodone, Melatonin, Viibryd  caused headaches, Lexapro , Xanax , Hydroxyzine  for   Allergies: Penicillins  Current Medications:  Current Outpatient Medications:    hydrochlorothiazide  (HYDRODIURIL ) 25 MG tablet, Take 1 tablet (25 mg total) by mouth daily., Disp: 90 tablet, Rfl: 3   rosuvastatin  (CRESTOR ) 10 MG tablet, Take 1 tablet (10 mg total) by mouth at bedtime., Disp: 90 tablet, Rfl: 3   ALPRAZolam  (XANAX ) 0.5 MG tablet, Take 1 tablet by mouth 2 times daily as needed for anxiety., Disp: 180 tablet, Rfl: 0   escitalopram  (LEXAPRO ) 10 MG tablet, 1.5 tablets daily, then increase to 2 the wk before menses., Disp: , Rfl:    levalbuterol  (XOPENEX  HFA) 45 MCG/ACT inhaler, Inhale 2 puffs into the lungs every 6 (six) hours as needed for wheezing. (Patient not taking: Reported on 01/02/2024),  Disp: 15 each, Rfl: 0   Spacer/Aero-Holding Chambers (COMPACT SPACE CHAMBER) DEVI, Use with the albuterol  inhaler, Disp: 1 each, Rfl: 0 Medication Side Effects: none  Family Medical/ Social History: Changes?  No  MENTAL HEALTH EXAM:  Last menstrual period 12/14/2023.There is no height or weight on file to calculate BMI.  General Appearance: Casual, Neat and Well Groomed  Eye Contact:  Good  Speech:  Clear and Coherent and Normal Rate  Volume:  Normal  Mood:  Anxious and Depressed  Affect:  Congruent  Thought Process:  Goal Directed and Descriptions of Associations: Circumstantial  Orientation:  Full (Time, Place, and Person)  Thought Content: Logical   Suicidal Thoughts:  No  Homicidal Thoughts:  No  Memory:  WNL  Judgement:  Good  Insight:  Good  Psychomotor Activity:  Normal  Concentration:  Concentration: Good and Attention Span: Good  Recall:  Good  Fund of Knowledge: Good  Language: Good  Assets:  Communication Skills Desire for Improvement Financial Resources/Insurance Housing Physical Health Resilience Social Support Transportation Vocational/Educational  ADL's:  Intact  Cognition: WNL  Prognosis:  Good   DIAGNOSES:    ICD-10-CM   1. Situational mixed anxiety and depressive disorder  F43.23     2. Insomnia, unspecified type  G47.00     3. PMDD (premenstrual dysphoric disorder)  F32.81      Receiving Psychotherapy: No   RECOMMENDATIONS:  PDMP was reviewed.  Last Xanax  filled 03/15/2023. I provided approximately  20 minutes of face to face time during this encounter, including time spent before and after the visit in records review, medical decision making,  counseling pertinent to today's visit, and charting.   I recommend increasing Lexapro .  Pros and cons discussed and she'd like to try it. Getting out of the unfulfilling workplace will be helpful too.   Continue Xanax  0.5 mg, 1 p.o. twice daily as needed. Usually only at night.  Increase Lexapro  to 15  mg daily, then 20 mg the week before menses.  Consider therapy. Return in 4-6  weeks.  Verneita Cooks, PA-C

## 2024-01-12 ENCOUNTER — Other Ambulatory Visit (HOSPITAL_COMMUNITY): Payer: Self-pay

## 2024-02-19 ENCOUNTER — Encounter: Payer: Self-pay | Admitting: Physician Assistant

## 2024-02-19 ENCOUNTER — Ambulatory Visit (INDEPENDENT_AMBULATORY_CARE_PROVIDER_SITE_OTHER): Admitting: Physician Assistant

## 2024-02-19 DIAGNOSIS — G4700 Insomnia, unspecified: Secondary | ICD-10-CM

## 2024-02-19 DIAGNOSIS — F4323 Adjustment disorder with mixed anxiety and depressed mood: Secondary | ICD-10-CM

## 2024-02-19 DIAGNOSIS — F3281 Premenstrual dysphoric disorder: Secondary | ICD-10-CM

## 2024-02-19 NOTE — Progress Notes (Signed)
 "     [facility]  Patient ID: Judy Foster,  MRN: [id number]  PCP: Oris Camie BRAVO, NP  Date of Evaluation: 02/19/2024 Time spent:20 minutes  Chief Complaint:  Chief Complaint   Depression; Anxiety; Follow-up    HISTORY/CURRENT STATUS: HPI for routine med check.  We increased the Lexapro  at the last visit.  She feels a lot better.  It is probably partly due to the medication but also the fact that she changed jobs.  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  It is a learning curve but she likes it.  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  She is more able to enjoy things.  Energy and motivation are good.  She is sleeping well most of the time.  Appetite is normal and weight is stable.  ADLs and personal hygiene are normal.  She does have anxiety at times, more so a sense of feeling overwhelmed, not having panic attacks.  The Xanax  is helpful when needed.  No mania, delirium, psychosis, or suicidal or homicidal thoughts.  Individual Medical History/ Review of Systems: Changes? :No     Past medications for mental health diagnoses include: Prozac caused drowiness and sexual SE, Wellbutrin , Zoloft  seemed to work for [employer], trazodone, Melatonin, Viibryd  caused headaches, Lexapro , Xanax , Hydroxyzine  for   Allergies: Penicillins  Current Medications:  Current Outpatient Medications:    ALPRAZolam  (XANAX ) 0.5 MG tablet, Take 1 tablet by mouth 2 times daily as needed for anxiety., Disp: 180 tablet, Rfl: 0   escitalopram  (LEXAPRO ) 10 MG tablet, 1.5 tablets daily, then increase to 2 the wk before menses., Disp: , Rfl:    hydrochlorothiazide  (HYDRODIURIL ) 25 MG tablet, Take 1 tablet (25 mg total) by mouth daily., Disp: 90 tablet, Rfl: 3   rosuvastatin  (CRESTOR ) 10 MG tablet, Take 1 tablet (10 mg total) by mouth at bedtime., Disp: 90 tablet, Rfl: 3   levalbuterol  (XOPENEX  HFA) 45 MCG/ACT inhaler, Inhale 2 puffs  into the lungs every 6 (six) hours as needed for wheezing. (Patient not taking: Reported on 01/02/2024), Disp: 15 each, Rfl: 0   Spacer/Aero-Holding Chambers (COMPACT SPACE CHAMBER) DEVI, Use with the albuterol  inhaler, Disp: 1 each, Rfl: 0 Medication Side Effects: none  Family Medical/ Social History: Changes?  Changed jobs.   MENTAL HEALTH EXAM:  There were no vitals taken for this visit.There is no height or weight on file to calculate BMI.  General Appearance: Casual, Neat and Well Groomed  Eye Contact:  Good  Speech:  Clear and Coherent and Normal Rate  Volume:  Normal  Mood:  Euthymic  Affect:  Congruent  Thought Process:  Goal Directed and Descriptions of Associations: Circumstantial  Orientation:  Full (Time, Place, and Person)  Thought Content: Logical   Suicidal Thoughts:  No  Homicidal Thoughts:  No  Memory:  WNL  Judgement:  Good  Insight:  Good  Psychomotor Activity:  Normal  Concentration:  Concentration: Good and Attention Span: Good  Recall:  Good  Fund of Knowledge: Good  Language: Good  Assets:  Communication Skills Desire for Improvement Financial Resources/Insurance Housing Physical Health Resilience Social Support Transportation Vocational/Educational  ADL's:  Intact  Cognition: WNL  Prognosis:  Good   DIAGNOSES:    ICD-10-CM   1. Situational mixed anxiety and depressive disorder  F43.23     2. Insomnia, unspecified type  G47.00     3. PMDD (premenstrual dysphoric disorder)  F32.81       Receiving Psychotherapy: No   RECOMMENDATIONS:  PDMP was reviewed.  Last Xanax  filled 03/15/2023. I provided approximately   20 minutes of face to face time during this encounter, including time spent before and after the visit in records review, medical decision making, counseling pertinent to today's visit, and charting.   I am glad to see her doing better.  No changes need to be made.  Continue Xanax  0.5 mg, 1 p.o. twice daily as needed. Usually only  at night.  Continue Lexapro   15 mg daily, then 20 mg the week before menses.  Consider therapy. Return in 6 months.  Verneita Cooks, PA-C  "

## 2024-02-24 ENCOUNTER — Other Ambulatory Visit (HOSPITAL_COMMUNITY): Payer: Self-pay

## 2024-02-24 ENCOUNTER — Encounter (HOSPITAL_COMMUNITY): Payer: Self-pay | Admitting: Pharmacist

## 2024-02-28 ENCOUNTER — Ambulatory Visit (HOSPITAL_BASED_OUTPATIENT_CLINIC_OR_DEPARTMENT_OTHER): Payer: Self-pay | Admitting: Obstetrics & Gynecology

## 2024-02-28 ENCOUNTER — Encounter (HOSPITAL_BASED_OUTPATIENT_CLINIC_OR_DEPARTMENT_OTHER): Payer: Self-pay | Admitting: Obstetrics & Gynecology

## 2024-02-28 VITALS — BP 127/80 | HR 78 | Ht 69.0 in | Wt 211.6 lb

## 2024-02-28 DIAGNOSIS — Z8742 Personal history of other diseases of the female genital tract: Secondary | ICD-10-CM

## 2024-02-28 DIAGNOSIS — I10 Essential (primary) hypertension: Secondary | ICD-10-CM

## 2024-02-28 DIAGNOSIS — Q667 Congenital pes cavus, unspecified foot: Secondary | ICD-10-CM

## 2024-02-28 DIAGNOSIS — Z1331 Encounter for screening for depression: Secondary | ICD-10-CM

## 2024-02-28 DIAGNOSIS — Z01419 Encounter for gynecological examination (general) (routine) without abnormal findings: Secondary | ICD-10-CM

## 2024-02-28 NOTE — Progress Notes (Signed)
 "  ANNUAL EXAM Patient name: Judy Foster MRN [id number]  Date of birth: 1975/03/14 Chief Complaint:   Annual Exam  History of Present Illness:   Judy Foster is a 49 y.o. [id number] Caucasian female being seen today for a routine annual exam.    LMP: In December.  Has normal cycles.  Flow lasts 3-4 days.  Bleeding is improved.  Last pap 10/05/2022. Results were: NILM w/ HRHPV not done. H/O abnormal pap: yes Last mammogram: 08/03/2023. Results were: normal. Family h/o breast cancer: yes mother Last colonoscopy: 12/14/2023. Results were: abnormal diverticula, polyps, and hemorrhoids. Family h/o colorectal cancer: yes maternal grandmother.  Follow up recommended for 10 years.       02/28/2024    8:37 AM 10/05/2022    8:15 AM 06/29/2022   10:28 AM 09/02/2021    8:34 AM 08/25/2021   11:20 AM  Depression screen PHQ 2/9  Decreased Interest 1 0 0 0 0  Down, Depressed, Hopeless 1 0 0 0 0  PHQ - 2 Score 2 0 0 0 0  Altered sleeping 1   0   Tired, decreased energy 1   0   Change in appetite 1   0   Feeling bad or failure about yourself  0   0   Trouble concentrating 1   0   Moving slowly or fidgety/restless 0   0   Suicidal thoughts 0   0   PHQ-9 Score 6   0    Difficult doing work/chores    Not difficult at all      Data saved with a previous flowsheet row definition        02/28/2024    8:38 AM 11/19/2020    8:37 AM 11/23/2017    9:23 AM  GAD 7 : Generalized Anxiety Score  Nervous, Anxious, on Edge 1 1   Control/stop worrying 0 2   Worry too much - different things 1 2   Trouble relaxing 1 1   Restless 0 0   Easily annoyed or irritable 1 1   Afraid - awful might happen 0 2   Total GAD 7 Score 4 9   Anxiety Difficulty  Somewhat difficult      Information is confidential and restricted. Go to Review Flowsheets to unlock data.     Review of Systems:   Pertinent items are noted in HPI Denies any urinary or bowel changes.  Denies pelvic pain. Pertinent History Reviewed:   Reviewed past medical,surgical, social and family history.  Reviewed problem list, medications and allergies. Physical Assessment:   Vitals:   02/28/24 0832  BP: 127/80  Pulse: 78  SpO2: 98%  Weight: 211 lb 9.6 oz (96 kg)  Height: 5' 9 (1.753 m)  Body mass index is 31.25 kg/m.        Physical Examination:   General appearance - well appearing, and in no distress  Mental status - alert, oriented to person, place, and time  Psych:  She has a normal mood and affect  Skin - warm and dry, normal color, no suspicious lesions noted  Chest - effort normal, all lung fields clear to auscultation bilaterally  Heart - normal rate and regular rhythm  Neck:  midline trachea, no thyromegaly or nodules  Breasts - breasts appear normal, no suspicious masses, no skin or nipple changes or  axillary nodes  Abdomen - soft, nontender, nondistended, no masses or organomegaly  Pelvic - VULVA: normal appearing vulva with no masses, tenderness  or lesions   VAGINA: normal appearing vagina with normal color and discharge, no lesions   CERVIX: normal appearing cervix without discharge or lesions, no CMT  Thin prep pap is not indicated  UTERUS: uterus is felt to be normal size, shape, consistency and nontender   ADNEXA: No adnexal masses or tenderness noted.  Rectal - normal rectal, good sphincter tone, no masses felt  Extremities:  No swelling or varicosities noted  Chaperone present for exam  No results found for this or any previous visit (from the past 24 hours).  Assessment & Plan:  1. Well woman exam with routine gynecological exam (Primary) - Pap smear neg 09/2022 - Mammogram 07/2023 - Colonoscopy 2025.  Hyperplastic polyps only seen - lab work done with PCP - vaccines reviewed/updated  2. History of menorrhagia - improved since hysteroscopy, myosure, D&C  3. Congenital pes cavus  4. Primary hypertension - on HCTZ   Follow-up: Return in about 1 year (around 02/27/2025).  Ronal GORMAN Pinal,  MD 02/28/2024 9:16 AM "

## 2024-04-23 ENCOUNTER — Encounter: Payer: Self-pay | Admitting: Nurse Practitioner

## 2024-04-25 ENCOUNTER — Encounter: Admitting: Nurse Practitioner

## 2024-08-19 ENCOUNTER — Ambulatory Visit: Payer: Self-pay | Admitting: Physician Assistant
